# Patient Record
Sex: Female | Born: 1990 | Hispanic: Yes | Marital: Single | Smoking: Never smoker
Health system: Southern US, Community
[De-identification: ages and names within clinical notes are randomized; demographics above are authoritative.]

## PROBLEM LIST (undated history)

## (undated) DIAGNOSIS — A159 Respiratory tuberculosis unspecified: Secondary | ICD-10-CM

## (undated) HISTORY — DX: Respiratory tuberculosis unspecified: A15.9

## (undated) HISTORY — PX: NO PAST SURGERIES: SHX2092

---

## 2012-04-24 ENCOUNTER — Ambulatory Visit
Admission: RE | Admit: 2012-04-24 | Discharge: 2012-04-24 | Disposition: A | Discharge status: Home / Self Care | Payer: No Typology Code available for payment source | Source: Ambulatory Visit | Attending: Infectious Diseases | Admitting: Infectious Diseases

## 2012-04-24 ENCOUNTER — Other Ambulatory Visit: Payer: Self-pay | Admitting: Infectious Diseases

## 2012-04-24 DIAGNOSIS — R7611 Nonspecific reaction to tuberculin skin test without active tuberculosis: Secondary | ICD-10-CM

## 2013-07-31 ENCOUNTER — Inpatient Hospital Stay (HOSPITAL_COMMUNITY)
Admission: AD | Admit: 2013-07-31 | Discharge: 2013-07-31 | Disposition: A | Discharge status: Home / Self Care | Payer: No Typology Code available for payment source | Source: Ambulatory Visit | Attending: Obstetrics & Gynecology | Admitting: Obstetrics & Gynecology

## 2013-07-31 ENCOUNTER — Ambulatory Visit (INDEPENDENT_AMBULATORY_CARE_PROVIDER_SITE_OTHER): Payer: No Typology Code available for payment source | Admitting: Physician Assistant

## 2013-07-31 ENCOUNTER — Encounter (HOSPITAL_COMMUNITY): Payer: Self-pay

## 2013-07-31 VITALS — BP 100/62 | HR 85 | Temp 98.5°F | Resp 18 | Ht 62.0 in | Wt 141.0 lb

## 2013-07-31 DIAGNOSIS — N912 Amenorrhea, unspecified: Secondary | ICD-10-CM

## 2013-07-31 DIAGNOSIS — Z331 Pregnant state, incidental: Secondary | ICD-10-CM

## 2013-07-31 DIAGNOSIS — Z3490 Encounter for supervision of normal pregnancy, unspecified, unspecified trimester: Secondary | ICD-10-CM

## 2013-07-31 DIAGNOSIS — R109 Unspecified abdominal pain: Secondary | ICD-10-CM | POA: Insufficient documentation

## 2013-07-31 DIAGNOSIS — Z23 Encounter for immunization: Secondary | ICD-10-CM

## 2013-07-31 DIAGNOSIS — O99891 Other specified diseases and conditions complicating pregnancy: Secondary | ICD-10-CM | POA: Insufficient documentation

## 2013-07-31 DIAGNOSIS — Z1389 Encounter for screening for other disorder: Secondary | ICD-10-CM

## 2013-07-31 LAB — URINALYSIS, ROUTINE W REFLEX MICROSCOPIC
Bilirubin Urine: NEGATIVE
Ketones, ur: 15 mg/dL — AB
Nitrite: NEGATIVE
Protein, ur: NEGATIVE mg/dL
Specific Gravity, Urine: 1.02 (ref 1.005–1.030)
Urobilinogen, UA: 0.2 mg/dL (ref 0.0–1.0)

## 2013-07-31 LAB — URINE MICROSCOPIC-ADD ON

## 2013-07-31 LAB — POCT URINE PREGNANCY: Preg Test, Ur: POSITIVE

## 2013-07-31 MED ORDER — PRENATAL VITAMINS 0.8 MG PO TABS
1.0000 | ORAL_TABLET | Freq: Every day | ORAL | Status: DC
Start: 2013-07-31 — End: 2013-09-03

## 2013-07-31 NOTE — MAU Note (Signed)
Patient states she has had positive home pregnancy test and went to Urgent Care Today and had confirmation. States she has had cramping over the past couple of weeks she has had cramping a couple of times. Denies bleeding or vaginal discharge, no nausea or vomiting. States she was told to come to for further evaluation.

## 2013-07-31 NOTE — Progress Notes (Signed)
Patient ID: Janice Barron MRN: [id number], DOB: 11/04/1990, 22 y.o. Date of Encounter: 07/31/2013, 12:26 PM  Primary Physician: No primary provider on file.  Chief Complaint: Pregnancy test  HPI: 22 y.o. G1P0 female with history below presents for pregnancy test. LMP was around the middle half of July. She is not 100% certain of the date of this menstrual cycle. She has taken two pregnancy tests at home with the last one being just after Halloween. Both of these came back positive. She would like to confirm these today. She states that she did have a small amount of spotting around the 20th of August and thought this was her usual period. She did not think much more of this.   She did have a fleeting chest pain lasting for a few seconds in October. None since. She also complains of some abdominal pain and cramping a couple of time this past month. The most recent episode occuring on 07/30/13. Cramping is located along her lower abdomen. Lasting a few seconds and self resolving.   She has not yet established care with an OB. Not currently taking a PNV or any other medications. No tobacco use, ETOH, or illicit drugs. No influenza vaccine this year. She does not remember when her last TDaP was.     History reviewed. No pertinent past medical history.   Home Meds: Prior to Admission medications   Not on File    Allergies: No Known Allergies  History   Social History  . Marital Status: Single    Spouse Name: N/A    Number of Children: N/A  . Years of Education: N/A   Occupational History  . Not on file.   Social History Main Topics  . Smoking status: Never Smoker   . Smokeless tobacco: Not on file  . Alcohol Use: Not on file  . Drug Use: Not on file  . Sexual Activity: Not on file   Other Topics Concern  . Not on file   Social History Narrative  . No narrative on file     Review of Systems: Constitutional: negative for chills, fever, or fatigue  HEENT:  negative for vision changes, hearing loss, congestion, rhinorrhea, ST, or sinus pressure Cardiovascular: positive for chest pain. negative for palpitations Respiratory: negative for wheezing, shortness of breath, or cough Abdominal: positive for abdominal cramping and pain. negative for nausea, vomiting, diarrhea, or constipation Genitourinary: see above Dermatological: negative for rash Neurologic: negative for headache, dizziness, or syncope   Physical Exam: Blood pressure 100/62, pulse 85, temperature 98.5 F (36.9 C), temperature source Oral, resp. rate 18, height 5\' 2"  (1.575 m), weight 141 lb (63.957 kg), last menstrual period 04/21/2013, SpO2 100.00%., Body mass index is 25.78 kg/(m^2). General: Well developed, well nourished, in no acute distress. Head: Normocephalic, atraumatic, eyes without discharge, sclera non-icteric, nares are without discharge.  Neck: Supple. No thyromegaly. Full ROM. No lymphadenopathy. Lungs: Clear bilaterally to auscultation without wheezes, rales, or rhonchi. Breathing is unlabored. Heart: RRR with S1 S2. No murmurs, rubs, or gallops appreciated. Abdomen: Soft, non-tender, non-distended with normoactive bowel sounds. No hepatosplenomegaly. No rebound/guarding. Superior aspect of the uterus palpable approximately 4 cm below the umbilicus. Msk:  Strength and tone normal for age. Extremities/Skin: Warm and dry. No clubbing or cyanosis. No edema. No rashes or suspicious lesions. Neuro: Alert and oriented X 3. Moves all extremities spontaneously. Gait is normal. CNII-XII grossly in tact. Psych:  Responds to questions appropriately with a normal affect.   Doppler: Fetal  pulse documented midline  Labs: Results for orders placed in visit on 07/31/13  POCT URINE PREGNANCY      Result Value Range   Preg Test, Ur Positive     Quantitative HCG pending  ASSESSMENT AND PLAN:  22 y.o. G1P0 female with newly diagnosed pregnancy and abdominal crmaping  -Will send  to MAU for further evaluation as patient did have some abdominal pain and cramping the previous day and has yet to have an [place] -Influenza vaccine -PNV -OB referral -HCG quantitative  -Patient need TDaP at 3rd trimester  -No MMR screening to date -Unable to determine EDD secondary to we do not know LMP date  Signed, Eula Listen, PA-C [facility] [place], [place] [address] [phone] 07/31/2013 12:26 PM

## 2013-07-31 NOTE — MAU Provider Note (Signed)
Chief Complaint: No chief complaint on file.   First Provider Initiated Contact with Patient 07/31/13 1620     SUBJECTIVE HPI: Janice Barron is a 22 y.o. G1P0 at [redacted]w[redacted]d by LMP who presents to maternity admissions sent over from [facility] for unknown gestational age.  She has a referral via Urgent Care for prenatal care at either [facility] or [facility].  She reports she has regular menses and had her period in July and then some light spotting in August.  Pt reports she had some cramping earlier this week but denies pain today.  She denies LOF, vaginal bleeding, vaginal itching/burning, urinary symptoms, h/a, dizziness, n/v, or fever/chills.     Past Medical History  Diagnosis Date  . Medical history non-contributory    Past Surgical History  Procedure Laterality Date  . No past surgeries     History   Social History  . Marital Status: Single    Spouse Name: N/A    Number of Children: N/A  . Years of Education: N/A   Occupational History  . Not on file.   Social History Main Topics  . Smoking status: Never Smoker   . Smokeless tobacco: Not on file  . Alcohol Use: No  . Drug Use: No  . Sexual Activity: No   Other Topics Concern  . Not on file   Social History Narrative  . No narrative on file   No current facility-administered medications on file prior to encounter.   Current Outpatient Prescriptions on File Prior to Encounter  Medication Sig Dispense Refill  . Prenatal Multivit-Min-Fe-FA (PRENATAL VITAMINS) 0.8 MG tablet Take 1 tablet by mouth daily.  30 tablet  11   No Known Allergies  ROS: Pertinent items in HPI  OBJECTIVE Blood pressure 105/58, pulse 81, temperature 99 F (37.2 C), temperature source Oral, resp. rate 16, height 5\' 2"  (1.575 m), weight 64.683 kg (142 lb 9.6 oz), last menstrual period 04/06/2013, SpO2 100.00%. GENERAL: Well-developed, well-nourished female in no acute distress.  HEENT:  Normocephalic HEART: normal rate RESP: normal effort ABDOMEN: Soft, non-tender EXTREMITIES: Nontender, no edema NEURO: Alert and oriented SPECULUM EXAM: Deferred  Fundal height halfway between pubic symphysis and umbilicus, consistent with 16 weeks  FHT 152 by doppler  LAB RESULTS Results for orders placed during the hospital encounter of 07/31/13 (from the past 24 hour(s))  URINALYSIS, ROUTINE W REFLEX MICROSCOPIC     Status: Abnormal   Collection Time    07/31/13  3:05 PM      Result Value Range   Color, Urine YELLOW  YELLOW   APPearance CLEAR  CLEAR   Specific Gravity, Urine 1.020  1.005 - 1.030   pH 7.0  5.0 - 8.0   Glucose, UA NEGATIVE  NEGATIVE mg/dL   Hgb urine dipstick TRACE (*) NEGATIVE   Bilirubin Urine NEGATIVE  NEGATIVE   Ketones, ur 15 (*) NEGATIVE mg/dL   Protein, ur NEGATIVE  NEGATIVE mg/dL   Urobilinogen, UA 0.2  0.0 - 1.0 mg/dL   Nitrite NEGATIVE  NEGATIVE   Leukocytes, UA SMALL (*) NEGATIVE  URINE MICROSCOPIC-ADD ON     Status: Abnormal   Collection Time    07/31/13  3:05 PM      Result Value Range   Squamous Epithelial / LPF FEW (*) RARE   WBC, UA 0-2  <3 WBC/hpf   RBC / HPF 0-2  <3 RBC/hpf   Bacteria, UA FEW (*) RARE   ASSESSMENT 1. Normal  IUP (intrauterine pregnancy) on prenatal ultrasound     PLAN Discharge home Teaching done about normal second trimester of pregnancy F/U with prenatal provider as scheduled Return to MAU as needed    Medication List    ASK your doctor about these medications       Prenatal Vitamins 0.8 MG tablet  Take 1 tablet by mouth daily.         Sharen Counter Certified Nurse-Midwife 07/31/2013  4:26 PM

## 2013-08-01 LAB — HCG, QUANTITATIVE, PREGNANCY: hCG, Beta Chain, Quant, S: 16806.4 m[IU]/mL

## 2013-08-12 ENCOUNTER — Encounter: Payer: Self-pay | Admitting: Family Medicine

## 2013-08-28 ENCOUNTER — Ambulatory Visit (INDEPENDENT_AMBULATORY_CARE_PROVIDER_SITE_OTHER): Payer: No Typology Code available for payment source | Admitting: Family Medicine

## 2013-08-28 ENCOUNTER — Encounter: Payer: Self-pay | Admitting: Family Medicine

## 2013-08-28 VITALS — BP 109/63 | Temp 98.7°F | Wt 149.3 lb

## 2013-08-28 DIAGNOSIS — O0930 Supervision of pregnancy with insufficient antenatal care, unspecified trimester: Secondary | ICD-10-CM

## 2013-08-28 DIAGNOSIS — N898 Other specified noninflammatory disorders of vagina: Secondary | ICD-10-CM

## 2013-08-28 DIAGNOSIS — O0932 Supervision of pregnancy with insufficient antenatal care, second trimester: Secondary | ICD-10-CM

## 2013-08-28 LAB — POCT URINALYSIS DIP (DEVICE)
Nitrite: NEGATIVE
Protein, ur: NEGATIVE mg/dL
Specific Gravity, Urine: 1.015 (ref 1.005–1.030)
Urobilinogen, UA: 0.2 mg/dL (ref 0.0–1.0)
pH: 7 (ref 5.0–8.0)

## 2013-08-28 LAB — OB RESULTS CONSOLE GC/CHLAMYDIA
Chlamydia: NEGATIVE
Gonorrhea: NEGATIVE

## 2013-08-28 NOTE — Progress Notes (Signed)
Nutrition note: 1st visit consult Pt has gained 9.3# @ [redacted]w[redacted]d, which is wnl. Pt reports eating 4-5x/d. Pt is taking PNV. Pt reports no N/V or heartburn. NKFA. Pt received verbal & written education on general nutrition during pregnancy.   Discussed wt gain goals of 15-25# or 0.6#/wk. Pt agrees to continue taking PNV. Pt has WIC and plans to BF. F/u if referred Blondell Reveal, MS, RD, LDN, [facility]

## 2013-08-28 NOTE — Addendum Note (Signed)
Addended by: Franchot Mimes on: 08/28/2013 11:22 AM   Modules accepted: Orders

## 2013-08-28 NOTE — Addendum Note (Signed)
Addended by: Franchot Mimes on: 08/28/2013 10:32 AM   Modules accepted: Orders

## 2013-08-28 NOTE — Progress Notes (Signed)
   Subjective:    Janice Barron is a G1P0 [redacted]w[redacted]d being seen today for her first obstetrical visit.  Her obstetrical history is significant for late to care. Patient does intend to breast feed. Pregnancy history fully reviewed.  Patient reports no bleeding, no contractions, no cramping and no leaking.  Filed Vitals:   08/28/13 0937  BP: 109/63  Temp: 98.7 F (37.1 C)  Weight: 67.722 kg (149 lb 4.8 oz)    HISTORY: OB History  Gravida Para Term Preterm AB SAB TAB Ectopic Multiple Living  1             # Outcome Date GA Lbr Len/2nd Weight Sex Delivery Anes PTL Lv  1 CUR              Past Medical History  Diagnosis Date  . Medical history non-contributory   . Tuberculosis     patient had positive tb skin test earlier this year and took meds, had normal chest xray   Past Surgical History  Procedure Laterality Date  . No past surgeries     Family History  Problem Relation Age of Onset  . Cancer Maternal Grandfather   . Diabetes Mother   . Hyperlipidemia Mother      Exam    Uterus:     Pelvic Exam:    Perineum: No Hemorrhoids   Vulva: normal   Vagina:  normal mucosa   pH:     Cervix: no cervical motion tenderness and no lesions   Adnexa: not evaluated secondary to pain with speculum exam on entry   Bony Pelvis: average  System:     Skin: normal coloration and turgor, no rashes    Neurologic: oriented, normal, negative, normal mood, gait normal; reflexes normal and symmetric   Extremities: normal strength, tone, and muscle mass, no deformities   HEENT extra ocular movement intact and sclera clear, anicteric   Mouth/Teeth mucous membranes moist, pharynx normal without lesions   Neck supple and no masses   Cardiovascular: regular rate and rhythm, no murmurs or gallops   Respiratory:  appears well, vitals normal, no respiratory distress, acyanotic, normal RR, ear and throat exam is normal, neck free of mass or lymphadenopathy, chest clear, no wheezing,  crepitations, rhonchi, normal symmetric air entry   Abdomen: soft, non-tender; bowel sounds normal; no masses,  no organomegaly   Urinary: urethral meatus normal      Assessment:    Pregnancy: G1P0 There are no active problems to display for this patient.       Plan:     22 y.o. y/o G1P0 at [redacted]w[redacted]d weeks by LMP, here for New OB visit.   Discussed with Patient: - All new OB labs ordered. (cbc, abo/RH,  GC/C, RPR, Rubella, HBsAg, HIV, Urine Cx) - Physiologic changes of pregnancy/ Safe meds in pregnancy/Diet modifications, [email or web address] - Routine precautions (SAB, depression, infection s/s).  Patient provided with all pertinent phone numbers for emergencies. - Review Safety measures: seat belt and proper seatbelt application - Dating [place] ordered; Patient to schedule. -  ordered Quad screen - RTC in 4 weeks for follow up. - Reviewed appropriate weight gain To Do: 1. Anatomy scan 2. Quad screen, pap, labs  [x ] Vaccines: Flu: rec'd   Tdap:  [ ]  BCM: undecided  Edu: [x ] PTL precautions; [ ]  BF class; [ ]  childbirth class; [ ]   BF counseling     Coreen Shippee RYAN 08/28/2013

## 2013-08-28 NOTE — Patient Instructions (Signed)
Segundo trimestre del embarazo  (Second Trimester of Pregnancy) El segundo trimestre del embarazo se extiende desde la semana 13 hasta la semana 28, del 4 al 6 mes. En general, es el momento del embarazo en el que se sentir mejor. Su organismo se ha adaptado a estar embarazada y comienza a sentirse fsicamente mejor. En general las nuseas matutinas han disminuido o han desaparecido completamente. El segundo trimestre es tambin la poca en la que el feto se desarrolla rpidamente. Hacia el final del sexto mes, el beb mide aproximadamente 9 pulgadas (23 cm) y pesa alrededor de 1  libras (700 g). Es probable que sienta mover al beb (dar pataditas) entre las 18 y 20 semanas del embarazo.  CAMBIOS CORPORALES  Su organismo atravesar numerosos cambios durante el embarazo. Los cambios varan de una mujer a otra.   Seguir aumentando de peso. Notar que la parte baja del abdomen se ensancha.  Podrn aparecer las primeras estras en las caderas, abdomen y mamas.  Es posible que sienta cefaleas, que se pueden aliviar con los medicamentos que su mdico le autorice a utilizar.  Tendr necesidad de orinar con ms frecuencia porque el feto est presionando en la vejiga.  Como consecuencia del embarazo, podr sentir acidez estomacal continuamente.  Podr estar constipada ya que ciertas hormonas hacen que los msculos que empujan los desechos a travs de los intestinos trabajen ms lentamente.  Pueden aparecer hemorroides o abultarse las venas (venas varicosas).  El dolor de espalda se debe al aumento de peso y a que las hormonas del embarazo relajan las articulaciones entre los huesos de la pelvis y a la modificacin del peso y a los msculos que soportan el equilibrio.  Sus mamas seguirn desarrollndose y estarn ms sensibles.  Las encas pueden sangrar y estar sensibles al cepillado y al hilo dental.  Pueden aparecer en el rostro zonas oscuras o manchas (cloasma, mscara del embarazo). Esto  desaparece despus del nacimiento del beb.  Es posible que se formeuna lnea oscura desde el ombligo a la zona del pubis (linea nigra). Esto desaparece despus del nacimiento del beb. QU DEBE ESPERAR EN LAS CONSULTAS PRENATALES  Durante una visita prenatal de rutina:   La pesarn para verificar que usted y el feto se encuentran dentro de los lmites normales.  Le tomarn la presin arterial.  Le medirn el abdomen para verificar el desarrollo del beb.  Escucharn los latidos fetales.  Se evaluarn los resultados de los estudios realizados en visitas anteriores. El mdico le preguntar:   Cmo se siente.  Si siente los movimientos del beb.  Si tiene sntomas anormales, como prdida de lquido, sangrado, dolores de cabeza intensos o clicos abdominales.  Si tiene alguna duda. Otros estudios que podrn realizarse durante el segundo trimestre son:   Anlisis de sangre para evaluar:  Niveles bajos de hierro (anemia).  Diabetes gestacional (entre las 24 y las 28 semanas).  Anticuerpos Rh.  Anlisis de orina para detectar infecciones, diabetes o protenas en la orina.  Una ecografa para confirmar si el crecimiento y el desarrollo del beb son los adecuados.  Una amniocentesis para diagnosticar posibles problemas genticos.  Estudios del feto para descartar espina bfida y sndrome de [place]. INSTRUCCIONES PARA EL CUIDADO EN EL HOGAR   Evite fumar, consumir hierbas, beber alcohol y utilizar frmacos que no ne hayan recetado. Estas sustancias qumicas afectan la formacin y el desarrollo del beb.  Siga las indicaciones del profesional con respecto a como tomar los medicamentos. Durante el embarazo, hay   medicamentos que son seguros y otros no lo son.  Realice actividad fsica slo segn las indicaciones del mdico. Sentir clicos uterinos es el mejor signo para detener la actividad fsica.  Contine haciendo comidas regulares y sanas.  Use un sostn que le brinde buen  soporte si sus mamas estn sensibles.  No utilice la baera con agua caliente, baos turcos y saunas.  Colquese el cinturn de seguridad cuando conduzca.  Evite comer carne cruda y el contacto con los utensilios y desperdicios de los gatos. Estos elementos contienen grmenes que pueden causar defectos de nacimiento en el beb.  Tome las vitaminas indicadas para la etapa prenatal.  Pruebe un laxante (si el mdico la autoriza) si tiene constipacin. Consuma ms alimentos ricos en fibra, como vegetales y frutas frescos y cereales enteros. Beba gran cantidad de lquido para mantener la orina de tono claro o amarillo plido.  Tome baos de agua tibia para calmar el dolor o las molestias causadas por las hemorroides. Use una crema para las hemorroides si el mdico la autoriza.  Si tiene venas varicosas, use medias de soporte. Eleve los pies durante 15 minutos, 3 o 4 veces por da. Limite el consumo de sal en su dieta.  Evite levantar objetos pesados, use zapatos de tacones bajos y mantenga una buena postura.  Descanse con las piernas elevadas si tiene calambres o dolor de cintura.  Visite a su dentista si no lo ha hecho durante el embarazo. Use un cepillo de dientes blando para higienizarse los dientes y use suavemente el hilo dental.  Puede continuar su vida sexual siempre que el mdico la autorice.  Concurra a todas las visitas prenatales segn las indicaciones de su mdico. SOLICITE ATENCIN MDICA SI:   Tiene mareos.  Siente clicos leves, presin en la pelvis o dolor persistente en el abdomen.  Tiene nuseas o vmitos o diarrea persistentes.  Observa una secrecin vaginal con mal olor.  Siente dolor al orinar. SOLICITE ATENCIN MDICA DE INMEDIATO SI:   Tiene fiebre.  Pierde lquido por la vagina.  Tiene sangrando o pequeas prdidas vaginales.  Siente dolor intenso o clicos en el abdomen.  Sube o baja de peso rpidamente.  Tiene dificultad para respirar y le duele  pecho.  Sbitamente se le hincha el rostro, las manos, los tobillos, los pies o las piernas de manera extrema.  No ha sentido los movimientos del beb durante una hora.  Siente un dolor de cabeza intenso que no se alivia con medicamentos.  Su visin se modifica. Document Released: 06/14/2005 Document Revised: 05/07/2013 [employer] Patient Information 2014 [employer].  

## 2013-08-28 NOTE — Progress Notes (Signed)
U/S scheduled 09/03/13 at 845 am.

## 2013-08-28 NOTE — Progress Notes (Signed)
Pulse: Patient reports that she has not had an ultrasound yet. The LMP that her dating is by was a guess she told the nurse in [facility]U.  Pt will do early glucola because her mother has type 2 diabetes.

## 2013-08-29 LAB — OBSTETRIC PANEL
Antibody Screen: NEGATIVE
Basophils Relative: 0 % (ref 0–1)
Eosinophils Absolute: 0 10*3/uL (ref 0.0–0.7)
Eosinophils Relative: 0 % (ref 0–5)
Lymphocytes Relative: 12 % (ref 12–46)
Lymphs Abs: 1.1 10*3/uL (ref 0.7–4.0)
MCH: 31.9 pg (ref 26.0–34.0)
MCHC: 34 g/dL (ref 30.0–36.0)
MCV: 93.9 fL (ref 78.0–100.0)
Neutrophils Relative %: 82 % — ABNORMAL HIGH (ref 43–77)
Platelets: 226 10*3/uL (ref 150–400)
RBC: 3.45 MIL/uL — ABNORMAL LOW (ref 3.87–5.11)
RDW: 13.4 % (ref 11.5–15.5)
Rubella: 2.1 Index — ABNORMAL HIGH (ref <0.90)

## 2013-08-29 LAB — HIV ANTIBODY (ROUTINE TESTING W REFLEX): HIV: NONREACTIVE

## 2013-08-29 LAB — PRESCRIPTION MONITORING PROFILE (19 PANEL)
Amphetamine/Meth: NEGATIVE ng/mL
Buprenorphine, Urine: NEGATIVE ng/mL
Cannabinoid Scrn, Ur: NEGATIVE ng/mL
Carisoprodol, Urine: NEGATIVE ng/mL
Creatinine, Urine: 45.4 mg/dL (ref >20.0)
Fentanyl, Ur: NEGATIVE ng/mL
MDMA URINE: NEGATIVE ng/mL
Meperidine, Ur: NEGATIVE ng/mL
Methadone Screen, Urine: NEGATIVE ng/mL
Methaqualone: NEGATIVE ng/mL
Oxycodone Screen, Ur: NEGATIVE ng/mL
Phencyclidine, Ur: NEGATIVE ng/mL
pH, Initial: 7.5 pH (ref 4.5–8.9)

## 2013-08-29 LAB — WET PREP, GENITAL
Clue Cells Wet Prep HPF POC: NONE SEEN
Yeast Wet Prep HPF POC: NONE SEEN

## 2013-09-01 LAB — HEMOGLOBINOPATHY EVALUATION
Hgb A: 97.2 % (ref 96.8–97.8)
Hgb S Quant: 0 %

## 2013-09-03 ENCOUNTER — Ambulatory Visit (HOSPITAL_COMMUNITY)
Admission: RE | Admit: 2013-09-03 | Discharge: 2013-09-03 | Disposition: A | Discharge status: Home / Self Care | Payer: No Typology Code available for payment source | Source: Ambulatory Visit | Attending: Family Medicine | Admitting: Family Medicine

## 2013-09-03 ENCOUNTER — Telehealth: Payer: Self-pay | Admitting: *Deleted

## 2013-09-03 DIAGNOSIS — O0932 Supervision of pregnancy with insufficient antenatal care, second trimester: Secondary | ICD-10-CM

## 2013-09-03 DIAGNOSIS — Z3689 Encounter for other specified antenatal screening: Secondary | ICD-10-CM | POA: Insufficient documentation

## 2013-09-03 DIAGNOSIS — Z3490 Encounter for supervision of normal pregnancy, unspecified, unspecified trimester: Secondary | ICD-10-CM

## 2013-09-03 MED ORDER — PRENATAL VITAMINS 0.8 MG PO TABS: 1.0000 | ORAL_TABLET | Freq: Every day | ORAL | Status: DC

## 2013-09-03 NOTE — Telephone Encounter (Signed)
Patient is requesting prenatal vitamins. Rx sent to her pharmacy.

## 2013-09-04 ENCOUNTER — Encounter: Payer: Self-pay | Admitting: Family Medicine

## 2013-09-18 NOTE — L&D Delivery Note (Signed)
Delivery Note At 8:28 AM a viable and healthy female was delivered via Vaginal, Spontaneous Delivery (Presentation: Right Occiput Anterior).  APGAR: 9, 9; weight pending .   Placenta status: Intact, Spontaneous.  Cord: 3 vessels Complications: None.    Anesthesia: Local  Episiotomy: None Lacerations: 3rd degree Suture Repair: 3.0 vicryl Est. Blood Loss (mL): 300ml  23 year old G1P0 @ 1464w4d induced delivered a viable female infant over an intact perineum. Active management of third stage of labor. Pitocin given. Placenta delivered spontaneously. 3 vessel cord, placenta complete. Partial 3rd degree laceration to the capsule muscles intact.  Repaired with figure of 3-0 Vicryl.   Mom to postpartum.  Baby to Couplet care / Skin to Skin.  Myriam JacobsonRobyn H Alvine Mostafa 01/08/2014, 9:32 AM

## 2013-09-23 ENCOUNTER — Ambulatory Visit (INDEPENDENT_AMBULATORY_CARE_PROVIDER_SITE_OTHER): Payer: No Typology Code available for payment source | Admitting: Obstetrics & Gynecology

## 2013-09-23 VITALS — BP 104/61 | Temp 98.8°F | Wt 154.7 lb

## 2013-09-23 DIAGNOSIS — Z3482 Encounter for supervision of other normal pregnancy, second trimester: Secondary | ICD-10-CM

## 2013-09-23 DIAGNOSIS — Z3480 Encounter for supervision of other normal pregnancy, unspecified trimester: Secondary | ICD-10-CM

## 2013-09-23 LAB — POCT URINALYSIS DIP (DEVICE)
Bilirubin Urine: NEGATIVE
Glucose, UA: NEGATIVE mg/dL
HGB URINE DIPSTICK: NEGATIVE
Ketones, ur: NEGATIVE mg/dL
Nitrite: NEGATIVE
PROTEIN: NEGATIVE mg/dL
SPECIFIC GRAVITY, URINE: 1.015 (ref 1.005–1.030)
Urobilinogen, UA: 0.2 mg/dL (ref 0.0–1.0)
pH: 7.5 (ref 5.0–8.0)

## 2013-09-23 NOTE — Progress Notes (Signed)
P=89 . C/o pain RLQ yesterday when walking, sometimes feels pain in hip when walking.

## 2013-09-23 NOTE — Patient Instructions (Signed)
Segundo trimestre del embarazo  (Second Trimester of Pregnancy) El segundo trimestre del embarazo se extiende desde la semana 13 hasta la semana 28, del 4 al 6 mes. En general, es el momento del embarazo en el que se sentir mejor. Su organismo se ha adaptado a estar embarazada y comienza a sentirse fsicamente mejor. En general las nuseas matutinas han disminuido o han desaparecido completamente. El segundo trimestre es tambin la poca en la que el feto se desarrolla rpidamente. Hacia el final del sexto mes, el beb mide aproximadamente 9 pulgadas (23 cm) y pesa alrededor de 1  libras (700 g). Es probable que sienta mover al beb (dar pataditas) entre las 18 y 20 semanas del embarazo.  CAMBIOS CORPORALES  Su organismo atravesar numerosos cambios durante el embarazo. Los cambios varan de una mujer a otra.   Seguir aumentando de peso. Notar que la parte baja del abdomen se ensancha.  Podrn aparecer las primeras estras en las caderas, abdomen y mamas.  Es posible que sienta cefaleas, que se pueden aliviar con los medicamentos que su mdico le autorice a utilizar.  Tendr necesidad de orinar con ms frecuencia porque el feto est presionando en la vejiga.  Como consecuencia del embarazo, podr sentir acidez estomacal continuamente.  Podr estar constipada ya que ciertas hormonas hacen que los msculos que empujan los desechos a travs de los intestinos trabajen ms lentamente.  Pueden aparecer hemorroides o abultarse las venas (venas varicosas).  El dolor de espalda se debe al aumento de peso y a que las hormonas del embarazo relajan las articulaciones entre los huesos de la pelvis y a la modificacin del peso y a los msculos que soportan el equilibrio.  Sus mamas seguirn desarrollndose y estarn ms sensibles.  Las encas pueden sangrar y estar sensibles al cepillado y al hilo dental.  Pueden aparecer en el rostro zonas oscuras o manchas (cloasma, mscara del embarazo). Esto  desaparece despus del nacimiento del beb.  Es posible que se formeuna lnea oscura desde el ombligo a la zona del pubis (linea nigra). Esto desaparece despus del nacimiento del beb. QU DEBE ESPERAR EN LAS CONSULTAS PRENATALES  Durante una visita prenatal de rutina:   La pesarn para verificar que usted y el feto se encuentran dentro de los lmites normales.  Le tomarn la presin arterial.  Le medirn el abdomen para verificar el desarrollo del beb.  Escucharn los latidos fetales.  Se evaluarn los resultados de los estudios realizados en visitas anteriores. El mdico le preguntar:   Cmo se siente.  Si siente los movimientos del beb.  Si tiene sntomas anormales, como prdida de lquido, sangrado, dolores de cabeza intensos o clicos abdominales.  Si tiene alguna duda. Otros estudios que podrn realizarse durante el segundo trimestre son:   Anlisis de sangre para evaluar:  Niveles bajos de hierro (anemia).  Diabetes gestacional (entre las 24 y las 28 semanas).  Anticuerpos Rh.  Anlisis de orina para detectar infecciones, diabetes o protenas en la orina.  Una ecografa para confirmar si el crecimiento y el desarrollo del beb son los adecuados.  Una amniocentesis para diagnosticar posibles problemas genticos.  Estudios del feto para descartar espina bfida y sndrome de [place]. INSTRUCCIONES PARA EL CUIDADO EN EL HOGAR   Evite fumar, consumir hierbas, beber alcohol y utilizar frmacos que no ne hayan recetado. Estas sustancias qumicas afectan la formacin y el desarrollo del beb.  Siga las indicaciones del profesional con respecto a como tomar los medicamentos. Durante el embarazo, hay   medicamentos que son seguros y otros no lo son.  Realice actividad fsica slo segn las indicaciones del mdico. Sentir clicos uterinos es el mejor signo para detener la actividad fsica.  Contine haciendo comidas regulares y sanas.  Use un sostn que le brinde buen  soporte si sus mamas estn sensibles.  No utilice la baera con agua caliente, baos turcos y saunas.  Colquese el cinturn de seguridad cuando conduzca.  Evite comer carne cruda y el contacto con los utensilios y desperdicios de los gatos. Estos elementos contienen grmenes que pueden causar defectos de nacimiento en el beb.  Tome las vitaminas indicadas para la etapa prenatal.  Pruebe un laxante (si el mdico la autoriza) si tiene constipacin. Consuma ms alimentos ricos en fibra, como vegetales y frutas frescos y cereales enteros. Beba gran cantidad de lquido para mantener la orina de tono claro o amarillo plido.  Tome baos de agua tibia para calmar el dolor o las molestias causadas por las hemorroides. Use una crema para las hemorroides si el mdico la autoriza.  Si tiene venas varicosas, use medias de soporte. Eleve los pies durante 15 minutos, 3 o 4 veces por da. Limite el consumo de sal en su dieta.  Evite levantar objetos pesados, use zapatos de tacones bajos y mantenga una buena postura.  Descanse con las piernas elevadas si tiene calambres o dolor de cintura.  Visite a su dentista si no lo ha hecho durante el embarazo. Use un cepillo de dientes blando para higienizarse los dientes y use suavemente el hilo dental.  Puede continuar su vida sexual siempre que el mdico la autorice.  Concurra a todas las visitas prenatales segn las indicaciones de su mdico. SOLICITE ATENCIN MDICA SI:   Tiene mareos.  Siente clicos leves, presin en la pelvis o dolor persistente en el abdomen.  Tiene nuseas o vmitos o diarrea persistentes.  Observa una secrecin vaginal con mal olor.  Siente dolor al orinar. SOLICITE ATENCIN MDICA DE INMEDIATO SI:   Tiene fiebre.  Pierde lquido por la vagina.  Tiene sangrando o pequeas prdidas vaginales.  Siente dolor intenso o clicos en el abdomen.  Sube o baja de peso rpidamente.  Tiene dificultad para respirar y le duele  pecho.  Sbitamente se le hincha el rostro, las manos, los tobillos, los pies o las piernas de manera extrema.  No ha sentido los movimientos del beb durante una hora.  Siente un dolor de cabeza intenso que no se alivia con medicamentos.  Su visin se modifica. Document Released: 06/14/2005 Document Revised: 05/07/2013 [employer] Patient Information 2014 [employer].  

## 2013-09-24 NOTE — Progress Notes (Signed)
Pt with no complaints. Needs 28 week labs next visit

## 2013-10-01 ENCOUNTER — Ambulatory Visit (HOSPITAL_COMMUNITY): Payer: No Typology Code available for payment source

## 2013-10-01 ENCOUNTER — Ambulatory Visit (HOSPITAL_COMMUNITY): Admission: RE | Admit: 2013-10-01 | Payer: No Typology Code available for payment source | Source: Ambulatory Visit

## 2013-10-07 ENCOUNTER — Other Ambulatory Visit: Payer: Self-pay | Admitting: Obstetrics & Gynecology

## 2013-10-07 ENCOUNTER — Ambulatory Visit (HOSPITAL_COMMUNITY)
Admission: RE | Admit: 2013-10-07 | Discharge: 2013-10-07 | Disposition: A | Discharge status: Home / Self Care | Payer: Medicaid Other | Source: Ambulatory Visit | Attending: Obstetrics & Gynecology | Admitting: Obstetrics & Gynecology

## 2013-10-07 DIAGNOSIS — Z09 Encounter for follow-up examination after completed treatment for conditions other than malignant neoplasm: Secondary | ICD-10-CM

## 2013-10-07 DIAGNOSIS — Z3689 Encounter for other specified antenatal screening: Secondary | ICD-10-CM | POA: Insufficient documentation

## 2013-10-15 ENCOUNTER — Encounter: Payer: Self-pay | Admitting: Advanced Practice Midwife

## 2013-10-15 ENCOUNTER — Ambulatory Visit (INDEPENDENT_AMBULATORY_CARE_PROVIDER_SITE_OTHER): Payer: Medicaid Other | Admitting: Advanced Practice Midwife

## 2013-10-15 ENCOUNTER — Encounter: Payer: Self-pay | Admitting: Obstetrics and Gynecology

## 2013-10-15 VITALS — BP 113/66 | Temp 97.7°F | Wt 159.2 lb

## 2013-10-15 DIAGNOSIS — Z23 Encounter for immunization: Secondary | ICD-10-CM

## 2013-10-15 DIAGNOSIS — Z3400 Encounter for supervision of normal first pregnancy, unspecified trimester: Secondary | ICD-10-CM

## 2013-10-15 DIAGNOSIS — Z3490 Encounter for supervision of normal pregnancy, unspecified, unspecified trimester: Secondary | ICD-10-CM

## 2013-10-15 LAB — CBC
HEMATOCRIT: 34.5 % — AB (ref 36.0–46.0)
Hemoglobin: 11.7 g/dL — ABNORMAL LOW (ref 12.0–15.0)
MCH: 31.8 pg (ref 26.0–34.0)
MCHC: 33.9 g/dL (ref 30.0–36.0)
MCV: 93.8 fL (ref 78.0–100.0)
PLATELETS: 225 10*3/uL (ref 150–400)
RBC: 3.68 MIL/uL — AB (ref 3.87–5.11)
RDW: 13.2 % (ref 11.5–15.5)
WBC: 9.5 10*3/uL (ref 4.0–10.5)

## 2013-10-15 LAB — POCT URINALYSIS DIP (DEVICE)
Bilirubin Urine: NEGATIVE
GLUCOSE, UA: NEGATIVE mg/dL
KETONES UR: NEGATIVE mg/dL
Nitrite: NEGATIVE
Protein, ur: NEGATIVE mg/dL
SPECIFIC GRAVITY, URINE: 1.02 (ref 1.005–1.030)
UROBILINOGEN UA: 0.2 mg/dL (ref 0.0–1.0)
pH: 7 (ref 5.0–8.0)

## 2013-10-15 MED ORDER — TETANUS-DIPHTH-ACELL PERTUSSIS 5-2.5-18.5 LF-MCG/0.5 IM SUSP: 0.5000 mL | Freq: Once | INTRAMUSCULAR | Status: DC

## 2013-10-15 MED ORDER — TETANUS-DIPHTH-ACELL PERTUSSIS 5-2.5-18.5 LF-MCG/0.5 IM SUSP
0.5000 mL | Freq: Once | INTRAMUSCULAR | Status: DC
Start: 2013-10-15 — End: 2013-10-29

## 2013-10-15 NOTE — Progress Notes (Signed)
Pulse- 95 Patient reports pelvic pain and states that her job is becoming more difficult because she has to lift heavy boxes often

## 2013-10-15 NOTE — Patient Instructions (Signed)
Third Trimester of Pregnancy  The third trimester is from week 29 through week 42, months 7 through 9. The third trimester is a time when the fetus is growing rapidly. At the end of the ninth month, the fetus is about 20 inches in length and weighs 6 10 pounds.   BODY CHANGES  Your body goes through many changes during pregnancy. The changes vary from woman to woman.    Your weight will continue to increase. You can expect to gain 25 35 pounds (11 16 kg) by the end of the pregnancy.   You may begin to get stretch marks on your hips, abdomen, and breasts.   You may urinate more often because the fetus is moving lower into your pelvis and pressing on your bladder.   You may develop or continue to have heartburn as a result of your pregnancy.   You may develop constipation because certain hormones are causing the muscles that push waste through your intestines to slow down.   You may develop hemorrhoids or swollen, bulging veins (varicose veins).   You may have pelvic pain because of the weight gain and pregnancy hormones relaxing your joints between the bones in your pelvis. Back aches may result from over exertion of the muscles supporting your posture.   Your breasts will continue to grow and be tender. A yellow discharge may leak from your breasts called colostrum.   Your belly button may stick out.   You may feel short of breath because of your expanding uterus.   You may notice the fetus "dropping," or moving lower in your abdomen.   You may have a bloody mucus discharge. This usually occurs a few days to a week before labor begins.   Your cervix becomes thin and soft (effaced) near your due date.  WHAT TO EXPECT AT YOUR PRENATAL EXAMS   You will have prenatal exams every 2 weeks until week 36. Then, you will have weekly prenatal exams. During a routine prenatal visit:   You will be weighed to make sure you and the fetus are growing normally.   Your blood pressure is taken.   Your abdomen will be  measured to track your baby's growth.   The fetal heartbeat will be listened to.   Any test results from the previous visit will be discussed.   You may have a cervical check near your due date to see if you have effaced.  At around 36 weeks, your caregiver will check your cervix. At the same time, your caregiver will also perform a test on the secretions of the vaginal tissue. This test is to determine if a type of bacteria, Group B streptococcus, is present. Your caregiver will explain this further.  Your caregiver may ask you:   What your birth plan is.   How you are feeling.   If you are feeling the baby move.   If you have had any abnormal symptoms, such as leaking fluid, bleeding, severe headaches, or abdominal cramping.   If you have any questions.  Other tests or screenings that may be performed during your third trimester include:   Blood tests that check for low iron levels (anemia).   Fetal testing to check the health, activity level, and growth of the fetus. Testing is done if you have certain medical conditions or if there are problems during the pregnancy.  FALSE LABOR  You may feel small, irregular contractions that eventually go away. These are called Braxton Hicks contractions, or   false labor. Contractions may last for hours, days, or even weeks before true labor sets in. If contractions come at regular intervals, intensify, or become painful, it is best to be seen by your caregiver.   SIGNS OF LABOR    Menstrual-like cramps.   Contractions that are 5 minutes apart or less.   Contractions that start on the top of the uterus and spread down to the lower abdomen and back.   A sense of increased pelvic pressure or back pain.   A watery or bloody mucus discharge that comes from the vagina.  If you have any of these signs before the 37th week of pregnancy, call your caregiver right away. You need to go to the hospital to get checked immediately.  HOME CARE INSTRUCTIONS    Avoid all  smoking, herbs, alcohol, and unprescribed drugs. These chemicals affect the formation and growth of the baby.   Follow your caregiver's instructions regarding medicine use. There are medicines that are either safe or unsafe to take during pregnancy.   Exercise only as directed by your caregiver. Experiencing uterine cramps is a good sign to stop exercising.   Continue to eat regular, healthy meals.   Wear a good support bra for breast tenderness.   Do not use hot tubs, steam rooms, or saunas.   Wear your seat belt at all times when driving.   Avoid raw meat, uncooked cheese, cat litter boxes, and soil used by cats. These carry germs that can cause birth defects in the baby.   Take your prenatal vitamins.   Try taking a stool softener (if your caregiver approves) if you develop constipation. Eat more high-fiber foods, such as fresh vegetables or fruit and whole grains. Drink plenty of fluids to keep your urine clear or pale yellow.   Take warm sitz baths to soothe any pain or discomfort caused by hemorrhoids. Use hemorrhoid cream if your caregiver approves.   If you develop varicose veins, wear support hose. Elevate your feet for 15 minutes, 3 4 times a day. Limit salt in your diet.   Avoid heavy lifting, wear low heal shoes, and practice good posture.   Rest a lot with your legs elevated if you have leg cramps or low back pain.   Visit your dentist if you have not gone during your pregnancy. Use a soft toothbrush to brush your teeth and be gentle when you floss.   A sexual relationship may be continued unless your caregiver directs you otherwise.   Do not travel far distances unless it is absolutely necessary and only with the approval of your caregiver.   Take prenatal classes to understand, practice, and ask questions about the labor and delivery.   Make a trial run to the hospital.   Pack your hospital bag.   Prepare the baby's nursery.   Continue to go to all your prenatal visits as directed  by your caregiver.  SEEK MEDICAL CARE IF:   You are unsure if you are in labor or if your water has broken.   You have dizziness.   You have mild pelvic cramps, pelvic pressure, or nagging pain in your abdominal area.   You have persistent nausea, vomiting, or diarrhea.   You have a bad smelling vaginal discharge.   You have pain with urination.  SEEK IMMEDIATE MEDICAL CARE IF:    You have a fever.   You are leaking fluid from your vagina.   You have spotting or bleeding from your vagina.     You have severe abdominal cramping or pain.   You have rapid weight loss or gain.   You have shortness of breath with chest pain.   You notice sudden or extreme swelling of your face, hands, ankles, feet, or legs.   You have not felt your baby move in over an hour.   You have severe headaches that do not go away with medicine.   You have vision changes.  Document Released: 08/29/2001 Document Revised: 05/07/2013 Document Reviewed: 11/05/2012  ExitCare Patient Information 2014 [employer].

## 2013-10-15 NOTE — Progress Notes (Signed)
Discussed lifting restrictions, will give letter. Works at [employer]. Glucola and labs today

## 2013-10-15 NOTE — Addendum Note (Signed)
Addended by: Louanna RawAMPBELL, Leahmarie Gasiorowski M on: 10/15/2013 11:07 AM   Modules accepted: Orders

## 2013-10-16 LAB — GLUCOSE TOLERANCE, 1 HOUR (50G) W/O FASTING: Glucose, 1 Hour GTT: 88 mg/dL (ref 70–140)

## 2013-10-16 LAB — RPR

## 2013-10-16 LAB — HIV ANTIBODY (ROUTINE TESTING W REFLEX): HIV: NONREACTIVE

## 2013-10-20 ENCOUNTER — Encounter: Payer: Self-pay | Admitting: Advanced Practice Midwife

## 2013-10-21 ENCOUNTER — Encounter: Payer: No Typology Code available for payment source | Admitting: Obstetrics and Gynecology

## 2013-10-29 ENCOUNTER — Encounter: Payer: Self-pay | Admitting: Obstetrics and Gynecology

## 2013-10-29 ENCOUNTER — Ambulatory Visit (INDEPENDENT_AMBULATORY_CARE_PROVIDER_SITE_OTHER): Payer: Medicaid Other | Admitting: Obstetrics and Gynecology

## 2013-10-29 VITALS — BP 107/65 | Temp 97.7°F | Wt 162.8 lb

## 2013-10-29 DIAGNOSIS — O0932 Supervision of pregnancy with insufficient antenatal care, second trimester: Secondary | ICD-10-CM

## 2013-10-29 DIAGNOSIS — O0930 Supervision of pregnancy with insufficient antenatal care, unspecified trimester: Secondary | ICD-10-CM

## 2013-10-29 LAB — POCT URINALYSIS DIP (DEVICE)
Bilirubin Urine: NEGATIVE
Glucose, UA: NEGATIVE mg/dL
Hgb urine dipstick: NEGATIVE
Ketones, ur: NEGATIVE mg/dL
NITRITE: NEGATIVE
PH: 7.5 (ref 5.0–8.0)
Protein, ur: NEGATIVE mg/dL
Specific Gravity, Urine: 1.02 (ref 1.005–1.030)
Urobilinogen, UA: 0.2 mg/dL (ref 0.0–1.0)

## 2013-10-29 NOTE — Progress Notes (Signed)
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 RLP discussed. Labs reviewed> normal.

## 2013-10-29 NOTE — Patient Instructions (Signed)
Contraception Choices Contraception (birth control) is the use of any methods or devices to prevent pregnancy. Below are some methods to help avoid pregnancy. HORMONAL METHODS   Contraceptive implant This is a thin, plastic tube containing progesterone hormone. It does not contain estrogen hormone. Your health care provider inserts the tube in the inner part of the upper arm. The tube can remain in place for up to 3 years. After 3 years, the implant must be removed. The implant prevents the ovaries from releasing an egg (ovulation), thickens the cervical mucus to prevent sperm from entering the uterus, and thins the lining of the inside of the uterus.  Progesterone-only injections These injections are given every 3 months by your health care provider to prevent pregnancy. This synthetic progesterone hormone stops the ovaries from releasing eggs. It also thickens cervical mucus and changes the uterine lining. This makes it harder for sperm to survive in the uterus.  Birth control pills These pills contain estrogen and progesterone hormone. They work by preventing the ovaries from releasing eggs (ovulation). They also cause the cervical mucus to thicken, preventing the sperm from entering the uterus. Birth control pills are prescribed by a health care provider.Birth control pills can also be used to treat heavy periods.  Minipill This type of birth control pill contains only the progesterone hormone. They are taken every day of each month and must be prescribed by your health care provider.  Birth control patch The patch contains hormones similar to those in birth control pills. It must be changed once a week and is prescribed by a health care provider.  Vaginal ring The ring contains hormones similar to those in birth control pills. It is left in the vagina for 3 weeks, removed for 1 week, and then a new one is put back in place. The patient must be comfortable inserting and removing the ring from the  vagina.A health care provider's prescription is necessary.  Emergency contraception Emergency contraceptives prevent pregnancy after unprotected sexual intercourse. This pill can be taken right after sex or up to 5 days after unprotected sex. It is most effective the sooner you take the pills after having sexual intercourse. Most emergency contraceptive pills are available without a prescription. Check with your pharmacist. Do not use emergency contraception as your only form of birth control. BARRIER METHODS   Female condom This is a thin sheath (latex or rubber) that is worn over the penis during sexual intercourse. It can be used with spermicide to increase effectiveness.  Female condom. This is a soft, loose-fitting sheath that is put into the vagina before sexual intercourse.  Diaphragm This is a soft, latex, dome-shaped barrier that must be fitted by a health care provider. It is inserted into the vagina, along with a spermicidal jelly. It is inserted before intercourse. The diaphragm should be left in the vagina for 6 to 8 hours after intercourse.  Cervical cap This is a round, soft, latex or plastic cup that fits over the cervix and must be fitted by a health care provider. The cap can be left in place for up to 48 hours after intercourse.  Sponge This is a soft, circular piece of polyurethane foam. The sponge has spermicide in it. It is inserted into the vagina after wetting it and before sexual intercourse.  Spermicides These are chemicals that kill or block sperm from entering the cervix and uterus. They come in the form of creams, jellies, suppositories, foam, or tablets. They do not require a   prescription. They are inserted into the vagina with an applicator before having sexual intercourse. The process must be repeated every time you have sexual intercourse. INTRAUTERINE CONTRACEPTION  Intrauterine device (IUD) This is a T-shaped device that is put in a woman's uterus during a  menstrual period to prevent pregnancy. There are 2 types:  Copper IUD This type of IUD is wrapped in copper wire and is placed inside the uterus. Copper makes the uterus and fallopian tubes produce a fluid that kills sperm. It can stay in place for 10 years.  Hormone IUD This type of IUD contains the hormone progestin (synthetic progesterone). The hormone thickens the cervical mucus and prevents sperm from entering the uterus, and it also thins the uterine lining to prevent implantation of a fertilized egg. The hormone can weaken or kill the sperm that get into the uterus. It can stay in place for 3 5 years, depending on which type of IUD is used. PERMANENT METHODS OF CONTRACEPTION  Female tubal ligation This is when the woman's fallopian tubes are surgically sealed, tied, or blocked to prevent the egg from traveling to the uterus.  Hysteroscopic sterilization This involves placing a small coil or insert into each fallopian tube. Your doctor uses a technique called hysteroscopy to do the procedure. The device causes scar tissue to form. This results in permanent blockage of the fallopian tubes, so the sperm cannot fertilize the egg. It takes about 3 months after the procedure for the tubes to become blocked. You must use another form of birth control for these 3 months.  Female sterilization This is when the female has the tubes that carry sperm tied off (vasectomy).This blocks sperm from entering the vagina during sexual intercourse. After the procedure, the man can still ejaculate fluid (semen). NATURAL PLANNING METHODS  Natural family planning This is not having sexual intercourse or using a barrier method (condom, diaphragm, cervical cap) on days the woman could become pregnant.  Calendar method This is keeping track of the length of each menstrual cycle and identifying when you are fertile.  Ovulation method This is avoiding sexual intercourse during ovulation.  Symptothermal method This is  avoiding sexual intercourse during ovulation, using a thermometer and ovulation symptoms.  Post ovulation method This is timing sexual intercourse after you have ovulated. Regardless of which type or method of contraception you choose, it is important that you use condoms to protect against the transmission of sexually transmitted infections (STIs). Talk with your health care provider about which form of contraception is most appropriate for you. Document Released: 09/04/2005 Document Revised: 05/07/2013 Document Reviewed: 02/27/2013 ExitCare Patient Information 2014 [employer].  

## 2013-10-29 NOTE — Progress Notes (Signed)
Pulse- 97 Patient reports pain in buttocks

## 2013-10-30 ENCOUNTER — Encounter: Payer: Self-pay | Admitting: *Deleted

## 2013-10-31 ENCOUNTER — Telehealth: Payer: Self-pay | Admitting: *Deleted

## 2013-10-31 NOTE — Telephone Encounter (Signed)
Per chart review patient is not out of work on leave- may work with weight restrictions. Called Kim at [employer] and discussed this.  She will follow up with patient.

## 2013-10-31 NOTE — Telephone Encounter (Signed)
Kim from [employer] called and left message stating she needs information to confirm date of disability, why patient is out of work, treatment plan and next office visit date. Call Kim at [phone] ext [phone]

## 2013-11-12 ENCOUNTER — Ambulatory Visit (INDEPENDENT_AMBULATORY_CARE_PROVIDER_SITE_OTHER): Payer: Medicaid Other | Admitting: Obstetrics and Gynecology

## 2013-11-12 ENCOUNTER — Encounter: Payer: Self-pay | Admitting: Obstetrics and Gynecology

## 2013-11-12 VITALS — BP 104/60 | Temp 97.5°F | Wt 164.3 lb

## 2013-11-12 DIAGNOSIS — O0930 Supervision of pregnancy with insufficient antenatal care, unspecified trimester: Secondary | ICD-10-CM

## 2013-11-12 DIAGNOSIS — O0932 Supervision of pregnancy with insufficient antenatal care, second trimester: Secondary | ICD-10-CM

## 2013-11-12 LAB — POCT URINALYSIS DIP (DEVICE)
Bilirubin Urine: NEGATIVE
Glucose, UA: NEGATIVE mg/dL
Ketones, ur: NEGATIVE mg/dL
Nitrite: NEGATIVE
PROTEIN: 30 mg/dL — AB
SPECIFIC GRAVITY, URINE: 1.02 (ref 1.005–1.030)
Urobilinogen, UA: 0.2 mg/dL (ref 0.0–1.0)
pH: 7 (ref 5.0–8.0)

## 2013-11-12 NOTE — Progress Notes (Signed)
P = 92 

## 2013-11-12 NOTE — Progress Notes (Signed)
Patient had no complaints. No nausea, vomiting and no discharge. She is still working.   PE:  See clinical sheet   Plan: Routine care.

## 2013-11-12 NOTE — Progress Notes (Signed)
Evaluation and management procedures were performed by Resident physician under my supervision/collaboration. Chart reviewed, patient examined by me and I agree with management and plan. [name], CNM 11/12/2013 3:02 PM

## 2013-11-12 NOTE — Patient Instructions (Signed)
Tercer trimestre del embarazo  (Third Trimester of Pregnancy) El tercer trimestre del embarazo abarca desde la semana 29 hasta la semana 42, desde el 7 mes hasta el 9. En este trimestre el feto se desarrolla muy rpidamente. Hacia el final del noveno mes, el beb que an no ha nacido mide alrededor de 20 pulgadas (45 cm) de largo y pesa entre 6 y 10 libras (2,700 y 4,500 kg).  CAMBIOS CORPORALES  Su organismo atravesar numerosos cambios durante el embarazo. Los cambios varan de una mujer a otra.   Seguir aumentando de peso. Es esperable que aumente entre 25 y 35 libras (11 16 kg) hacia el final del embarazo.  Podrn aparecer las primeras estras en las caderas, abdomen y mamas.  Tendr necesidad de orinar con ms frecuencia porque el feto baja hacia la pelvis y presiona en la vejiga.  Como consecuencia del embarazo, podr sentir acidez estomacal continuamente.  Podr estar constipada ya que ciertas hormonas hacen que los msculos que hacen progresar los desechos a travs de los intestinos trabajen ms lentamente.  Pueden aparecer hemorroides o abultarse e hincharse las venas (venas varicosas).  Podr sentir dolor plvico debido al aumento de peso ya que las hormonas del embarazo relajan las articulaciones entre los huesos de la pelvis. El dolor de espalda puede ser consecuencia de la exigencia de los msculos que soportan la postura.  Sus mamas seguirn desarrollndose y estarn ms sensibles. A veces sale una secrecin amarilla de las mamas, que se llama calostro.  El ombligo puede salir hacia afuera.  Podr sentir que le falta el aire debido a que se expande el tero.  Podr notar que el feto "baja" o que se siente ms bajo en el abdomen.  Podr tener una prdida de secrecin mucosa con sangre. Esto suele ocurrir entre unos pocos das y una semana antes del parto.  El cuello se vuelve delgado y blando (se borra) cerca de la fecha de parto. QU DEBE ESPERAR EN LAS CONSULTAS  PRENATALES  Le harn exmenes prenatales cada 2 semanas hasta la semana 36. A partir de ese momento le harn exmenes semanales. Durante una visita prenatal de rutina:   La pesarn para verificar que usted y el feto se encuentran dentro de los lmites normales.  Le tomarn la presin arterial.  Le medirn el abdomen para verificar el desarrollo del beb.  Escucharn los latidos fetales.  Se evaluarn los resultados de los estudios solicitados en visitas anteriores.  Le controlarn el cuello del tero cuando est prxima la fecha de parto para ver si se ha borrado. Alrededor de la semana 36 el mdico controlar el cuello del tero. Al mismo tiempo realizar un anlisis de las secreciones del tejido vaginal. Este examen es para determinar si hay un tipo de bacteria, estreptococo Grupo B. El mdico le explicar esto con ms detalle.  El mdico podr preguntarle:   Como le gustara que fuera el parto.  Cmo se siente.  Si siente los movimientos del beb.  Si tiene sntomas anormales, como prdida de lquido, sangrado, dolores de cabeza intenso o clicos abdominales.  Si tiene alguna duda. Otros estudios que podrn realizarse durante el tercer trimestre son:   Anlisis de sangre para controlar sus niveles de hierro (anemia).  Controles fetales para determinar su salud, el nivel de actividad y su desarrollo. Si tiene alguna enfermedad o si tuvo problemas durante el embarazo, le harn estudios. FALSO TRABAJO DE PARTO  Es posible que sienta contracciones pequeas e irregulares que finalmente   desaparecen. Se llaman contracciones de Braxton Hicks o falso trabajo de parto. Las contracciones pueden durar horas, das o an semanas antes de que el verdadero trabajo de parto se inicie. Si las contracciones tienen intervalos regulares, se intensifican o se hacen dolorosas, lo mejor es que la revise su mdico.  SIGNOS DE TRABAJO DE PARTO   Espasmos del tipo menstrual.  Contracciones cada 5  minutos o menos.  Contracciones que comienzan en la parte superior del tero y se expanden hacia abajo, a la zona inferior del abdomen y la espalda.  Sensacin de presin que aumenta en la pelvis o dolor en la espalda.  Aparece una secrecin acuosa o sanguinolenta por la vagina. Si tiene alguno de estos signos antes de la semana 37 del embarazo, llame a su mdico inmediatamente. Debe concurrir al hospital para ser controlada inmediatamente.  INSTRUCCIONES PARA EL CUIDADO EN EL HOGAR   Evite fumar, consumir hierbas, beber alcohol y utilizar frmacos que no le hayan recetado. Estas sustancias qumicas afectan la formacin y el desarrollo del beb.  Siga las indicaciones del profesional con respecto a como tomar los medicamentos. Durante el embarazo, hay medicamentos que son seguros y otros no lo son.  Realice actividad fsica slo segn las indicaciones del mdico. Sentir clicos uterinos es el mejor signo para detener la actividad fsica.  Contine haciendo comidas regulares y sanas.  Use un sostn que le brinde buen soporte si sus mamas estn sensibles.  No utilice la baera con agua caliente, baos turcos o saunas.  Colquese el cinturn de seguridad cuando conduzca.  Evite comer carne cruda queso sin cocinar y el contacto con los utensilios y desperdicios de los gatos. Estos elementos contienen grmenes que pueden causar defectos de nacimiento en el beb.  Tome las vitaminas indicadas para la etapa prenatal.  Pruebe un laxante (si el mdico la autoriza) si tiene constipacin. Consuma ms alimentos ricos en fibra, como vegetales y frutas frescos y cereales enteros. Beba gran cantidad de lquido para mantener la orina de tono claro o amarillo plido.  Tome baos de agua tibia para calmar el dolor o las molestias causadas por las hemorroides. Use una crema para las hemorroides si el mdico la autoriza.  Si tiene venas varicosas, use medias de soporte. Eleve los pies durante 15 minutos,  3 4 veces por da. Limite el consumo de sal en su dieta.  Evite levantar objetos pesados, use zapatos de tacones bajos y mantenga una buena postura.  Descanse con las piernas elevadas si tiene calambres o dolor de cintura.  Visite a su dentista si no lo ha hecho durante el embarazo. Use un cepillo de dientes blando para higienizarse los dientes y use suavemente el hilo dental.  Puede continuar su vida sexual excepto que el mdico le indique otra cosa.  No haga viajes largos excepto que sea absolutamente necesario y slo con la aprobacin de su mdico.  Tome clases prenatales para entender, practicar y hacer preguntas sobre el trabajo de parto y el alumbramiento.  Haga un ensayo sobre la partida al hospital.  Prepare el bolso que llevar al hospital.  Prepare la habitacin del beb.  Contine concurriendo a todas las visitas prenatales segn las indicaciones de su mdico. SOLICITE ATENCIN MDICA SI:   No est segura si est en trabajo de parto o ha roto la bolsa de aguas.  Tiene mareos.  Siente clicos leves, presin en la pelvis o dolor persistente en el abdomen.  Tiene nuseas o vmitos persistentes.  Observa una   secrecin vaginal con mal olor.  Siente dolor al orinar. SOLICITE ATENCIN MDICA DE INMEDIATO SI:   Tiene fiebre.  Pierde lquido o sangre por la vagina.  Tiene sangrado o pequeas prdidas vaginales.  Siente dolor intenso o clicos en el abdomen.  Sube o baja de peso rpidamente.  Le falta el aire y le duele el pecho al respirar.  Sbitamente se le hincha el rostro, las manos, los tobillos, los pies o las piernas de manera extrema.  No ha sentido los movimientos del beb durante una hora.  Siente un dolor de cabeza intenso que no se alivia con medicamentos.  Su visin se modifica. Document Released: 06/14/2005 Document Revised: 05/07/2013 ExitCare Patient Information 2014 [employer].  

## 2013-11-24 ENCOUNTER — Ambulatory Visit (HOSPITAL_COMMUNITY): Payer: No Typology Code available for payment source

## 2013-11-26 ENCOUNTER — Ambulatory Visit (INDEPENDENT_AMBULATORY_CARE_PROVIDER_SITE_OTHER): Payer: Medicaid Other | Admitting: Obstetrics and Gynecology

## 2013-11-26 VITALS — BP 115/60 | Temp 98.0°F | Wt 168.7 lb

## 2013-11-26 DIAGNOSIS — O0932 Supervision of pregnancy with insufficient antenatal care, second trimester: Secondary | ICD-10-CM

## 2013-11-26 DIAGNOSIS — O0930 Supervision of pregnancy with insufficient antenatal care, unspecified trimester: Secondary | ICD-10-CM

## 2013-11-26 LAB — POCT URINALYSIS DIP (DEVICE)
Bilirubin Urine: NEGATIVE
Glucose, UA: NEGATIVE mg/dL
Ketones, ur: NEGATIVE mg/dL
Nitrite: NEGATIVE
PROTEIN: NEGATIVE mg/dL
SPECIFIC GRAVITY, URINE: 1.02 (ref 1.005–1.030)
UROBILINOGEN UA: 0.2 mg/dL (ref 0.0–1.0)
pH: 7 (ref 5.0–8.0)

## 2013-11-26 NOTE — Patient Instructions (Signed)
Tercer trimestre del embarazo  (Third Trimester of Pregnancy) El tercer trimestre del embarazo abarca desde la semana 29 hasta la semana 42, desde el 7 mes hasta el 9. En este trimestre el feto se desarrolla muy rpidamente. Hacia el final del noveno mes, el beb que an no ha nacido mide alrededor de 20 pulgadas (45 cm) de largo y pesa entre 6 y 10 libras (2,700 y 4,500 kg).  CAMBIOS CORPORALES  Su organismo atravesar numerosos cambios durante el embarazo. Los cambios varan de una mujer a otra.   Seguir aumentando de peso. Es esperable que aumente entre 25 y 35 libras (11 16 kg) hacia el final del embarazo.  Podrn aparecer las primeras estras en las caderas, abdomen y mamas.  Tendr necesidad de orinar con ms frecuencia porque el feto baja hacia la pelvis y presiona en la vejiga.  Como consecuencia del embarazo, podr sentir acidez estomacal continuamente.  Podr estar constipada ya que ciertas hormonas hacen que los msculos que hacen progresar los desechos a travs de los intestinos trabajen ms lentamente.  Pueden aparecer hemorroides o abultarse e hincharse las venas (venas varicosas).  Podr sentir dolor plvico debido al aumento de peso ya que las hormonas del embarazo relajan las articulaciones entre los huesos de la pelvis. El dolor de espalda puede ser consecuencia de la exigencia de los msculos que soportan la postura.  Sus mamas seguirn desarrollndose y estarn ms sensibles. A veces sale una secrecin amarilla de las mamas, que se llama calostro.  El ombligo puede salir hacia afuera.  Podr sentir que le falta el aire debido a que se expande el tero.  Podr notar que el feto "baja" o que se siente ms bajo en el abdomen.  Podr tener una prdida de secrecin mucosa con sangre. Esto suele ocurrir entre unos pocos das y una semana antes del parto.  El cuello se vuelve delgado y blando (se borra) cerca de la fecha de parto. QU DEBE ESPERAR EN LAS CONSULTAS  PRENATALES  Le harn exmenes prenatales cada 2 semanas hasta la semana 36. A partir de ese momento le harn exmenes semanales. Durante una visita prenatal de rutina:   La pesarn para verificar que usted y el feto se encuentran dentro de los lmites normales.  Le tomarn la presin arterial.  Le medirn el abdomen para verificar el desarrollo del beb.  Escucharn los latidos fetales.  Se evaluarn los resultados de los estudios solicitados en visitas anteriores.  Le controlarn el cuello del tero cuando est prxima la fecha de parto para ver si se ha borrado. Alrededor de la semana 36 el mdico controlar el cuello del tero. Al mismo tiempo realizar un anlisis de las secreciones del tejido vaginal. Este examen es para determinar si hay un tipo de bacteria, estreptococo Grupo B. El mdico le explicar esto con ms detalle.  El mdico podr preguntarle:   Como le gustara que fuera el parto.  Cmo se siente.  Si siente los movimientos del beb.  Si tiene sntomas anormales, como prdida de lquido, sangrado, dolores de cabeza intenso o clicos abdominales.  Si tiene alguna duda. Otros estudios que podrn realizarse durante el tercer trimestre son:   Anlisis de sangre para controlar sus niveles de hierro (anemia).  Controles fetales para determinar su salud, el nivel de actividad y su desarrollo. Si tiene alguna enfermedad o si tuvo problemas durante el embarazo, le harn estudios. FALSO TRABAJO DE PARTO  Es posible que sienta contracciones pequeas e irregulares que finalmente   desaparecen. Se llaman contracciones de Braxton Hicks o falso trabajo de parto. Las contracciones pueden durar horas, das o an semanas antes de que el verdadero trabajo de parto se inicie. Si las contracciones tienen intervalos regulares, se intensifican o se hacen dolorosas, lo mejor es que la revise su mdico.  SIGNOS DE TRABAJO DE PARTO   Espasmos del tipo menstrual.  Contracciones cada 5  minutos o menos.  Contracciones que comienzan en la parte superior del tero y se expanden hacia abajo, a la zona inferior del abdomen y la espalda.  Sensacin de presin que aumenta en la pelvis o dolor en la espalda.  Aparece una secrecin acuosa o sanguinolenta por la vagina. Si tiene alguno de estos signos antes de la semana 37 del embarazo, llame a su mdico inmediatamente. Debe concurrir al hospital para ser controlada inmediatamente.  INSTRUCCIONES PARA EL CUIDADO EN EL HOGAR   Evite fumar, consumir hierbas, beber alcohol y utilizar frmacos que no le hayan recetado. Estas sustancias qumicas afectan la formacin y el desarrollo del beb.  Siga las indicaciones del profesional con respecto a como tomar los medicamentos. Durante el embarazo, hay medicamentos que son seguros y otros no lo son.  Realice actividad fsica slo segn las indicaciones del mdico. Sentir clicos uterinos es el mejor signo para detener la actividad fsica.  Contine haciendo comidas regulares y sanas.  Use un sostn que le brinde buen soporte si sus mamas estn sensibles.  No utilice la baera con agua caliente, baos turcos o saunas.  Colquese el cinturn de seguridad cuando conduzca.  Evite comer carne cruda queso sin cocinar y el contacto con los utensilios y desperdicios de los gatos. Estos elementos contienen grmenes que pueden causar defectos de nacimiento en el beb.  Tome las vitaminas indicadas para la etapa prenatal.  Pruebe un laxante (si el mdico la autoriza) si tiene constipacin. Consuma ms alimentos ricos en fibra, como vegetales y frutas frescos y cereales enteros. Beba gran cantidad de lquido para mantener la orina de tono claro o amarillo plido.  Tome baos de agua tibia para calmar el dolor o las molestias causadas por las hemorroides. Use una crema para las hemorroides si el mdico la autoriza.  Si tiene venas varicosas, use medias de soporte. Eleve los pies durante 15 minutos,  3 4 veces por da. Limite el consumo de sal en su dieta.  Evite levantar objetos pesados, use zapatos de tacones bajos y mantenga una buena postura.  Descanse con las piernas elevadas si tiene calambres o dolor de cintura.  Visite a su dentista si no lo ha hecho durante el embarazo. Use un cepillo de dientes blando para higienizarse los dientes y use suavemente el hilo dental.  Puede continuar su vida sexual excepto que el mdico le indique otra cosa.  No haga viajes largos excepto que sea absolutamente necesario y slo con la aprobacin de su mdico.  Tome clases prenatales para entender, practicar y hacer preguntas sobre el trabajo de parto y el alumbramiento.  Haga un ensayo sobre la partida al hospital.  Prepare el bolso que llevar al hospital.  Prepare la habitacin del beb.  Contine concurriendo a todas las visitas prenatales segn las indicaciones de su mdico. SOLICITE ATENCIN MDICA SI:   No est segura si est en trabajo de parto o ha roto la bolsa de aguas.  Tiene mareos.  Siente clicos leves, presin en la pelvis o dolor persistente en el abdomen.  Tiene nuseas o vmitos persistentes.  Observa una   secrecin vaginal con mal olor.  Siente dolor al orinar. SOLICITE ATENCIN MDICA DE INMEDIATO SI:   Tiene fiebre.  Pierde lquido o sangre por la vagina.  Tiene sangrado o pequeas prdidas vaginales.  Siente dolor intenso o clicos en el abdomen.  Sube o baja de peso rpidamente.  Le falta el aire y le duele el pecho al respirar.  Sbitamente se le hincha el rostro, las manos, los tobillos, los pies o las piernas de manera extrema.  No ha sentido los movimientos del beb durante una hora.  Siente un dolor de cabeza intenso que no se alivia con medicamentos.  Su visin se modifica. Document Released: 06/14/2005 Document Revised: 05/07/2013 ExitCare Patient Information 2014 [employer].  

## 2013-11-26 NOTE — Progress Notes (Signed)
P= 86 C/o of intermittent lower abdominal/pelvic pressure.

## 2013-11-26 NOTE — Progress Notes (Signed)
Complaints of being tired. She currently works at [employer]. She pushes up to 25lbs but does not lift. She is having intermittent pelvic pain/pressure 1-2 times per day. She is having some transparent vaginal discharge. No leaking of fluid. No bloody discharge. Good fetal movement.

## 2013-12-10 ENCOUNTER — Ambulatory Visit (INDEPENDENT_AMBULATORY_CARE_PROVIDER_SITE_OTHER): Payer: Medicaid Other | Admitting: Advanced Practice Midwife

## 2013-12-10 VITALS — BP 115/62 | Temp 98.2°F | Wt 171.9 lb

## 2013-12-10 DIAGNOSIS — O0930 Supervision of pregnancy with insufficient antenatal care, unspecified trimester: Secondary | ICD-10-CM

## 2013-12-10 DIAGNOSIS — O0932 Supervision of pregnancy with insufficient antenatal care, second trimester: Secondary | ICD-10-CM

## 2013-12-10 LAB — POCT URINALYSIS DIP (DEVICE)
BILIRUBIN URINE: NEGATIVE
GLUCOSE, UA: NEGATIVE mg/dL
KETONES UR: NEGATIVE mg/dL
NITRITE: NEGATIVE
PH: 7 (ref 5.0–8.0)
Protein, ur: NEGATIVE mg/dL
Specific Gravity, Urine: 1.02 (ref 1.005–1.030)
Urobilinogen, UA: 0.2 mg/dL (ref 0.0–1.0)

## 2013-12-10 LAB — OB RESULTS CONSOLE GBS: GBS: NEGATIVE

## 2013-12-10 NOTE — Progress Notes (Signed)
Doing well. Cultures and GBS done. Declines cervix exam. Wants to stop work next week. Wants to wait till next week to get her letter.

## 2013-12-10 NOTE — Progress Notes (Signed)
P= 92 C/o of intermittent lower abdominal/pelvic pressure and braxton hicks.

## 2013-12-10 NOTE — Patient Instructions (Signed)

## 2013-12-11 LAB — GC/CHLAMYDIA PROBE AMP
CT Probe RNA: NEGATIVE
GC Probe RNA: NEGATIVE

## 2013-12-13 LAB — CULTURE, STREPTOCOCCUS GRP B W/SUSCEPT

## 2013-12-17 ENCOUNTER — Ambulatory Visit (INDEPENDENT_AMBULATORY_CARE_PROVIDER_SITE_OTHER): Payer: Medicaid Other | Admitting: Advanced Practice Midwife

## 2013-12-17 ENCOUNTER — Encounter: Payer: Self-pay | Admitting: Advanced Practice Midwife

## 2013-12-17 ENCOUNTER — Encounter: Payer: Self-pay | Admitting: Obstetrics & Gynecology

## 2013-12-17 VITALS — BP 108/62 | Temp 97.3°F | Wt 174.9 lb

## 2013-12-17 DIAGNOSIS — O0932 Supervision of pregnancy with insufficient antenatal care, second trimester: Secondary | ICD-10-CM

## 2013-12-17 DIAGNOSIS — O0930 Supervision of pregnancy with insufficient antenatal care, unspecified trimester: Secondary | ICD-10-CM

## 2013-12-17 LAB — POCT URINALYSIS DIP (DEVICE)
BILIRUBIN URINE: NEGATIVE
Glucose, UA: NEGATIVE mg/dL
KETONES UR: NEGATIVE mg/dL
Nitrite: NEGATIVE
PH: 7 (ref 5.0–8.0)
Protein, ur: NEGATIVE mg/dL
Specific Gravity, Urine: 1.015 (ref 1.005–1.030)
Urobilinogen, UA: 0.2 mg/dL (ref 0.0–1.0)

## 2013-12-17 NOTE — Progress Notes (Signed)
Pulse- 100 Patient reports pelvic & back pain/pressure

## 2013-12-17 NOTE — Patient Instructions (Signed)

## 2013-12-19 NOTE — Progress Notes (Signed)
Doing well but ready to stop work. Note given

## 2013-12-24 ENCOUNTER — Ambulatory Visit (INDEPENDENT_AMBULATORY_CARE_PROVIDER_SITE_OTHER): Payer: Medicaid Other | Admitting: Family Medicine

## 2013-12-24 VITALS — BP 112/68 | Temp 98.3°F | Wt 175.9 lb

## 2013-12-24 DIAGNOSIS — O0932 Supervision of pregnancy with insufficient antenatal care, second trimester: Secondary | ICD-10-CM

## 2013-12-24 DIAGNOSIS — O0930 Supervision of pregnancy with insufficient antenatal care, unspecified trimester: Secondary | ICD-10-CM

## 2013-12-24 DIAGNOSIS — R8271 Bacteriuria: Secondary | ICD-10-CM

## 2013-12-24 DIAGNOSIS — O99891 Other specified diseases and conditions complicating pregnancy: Secondary | ICD-10-CM

## 2013-12-24 DIAGNOSIS — O9989 Other specified diseases and conditions complicating pregnancy, childbirth and the puerperium: Secondary | ICD-10-CM

## 2013-12-24 LAB — POCT URINALYSIS DIP (DEVICE)
Bilirubin Urine: NEGATIVE
GLUCOSE, UA: NEGATIVE mg/dL
KETONES UR: NEGATIVE mg/dL
Nitrite: NEGATIVE
PROTEIN: 30 mg/dL — AB
SPECIFIC GRAVITY, URINE: 1.02 (ref 1.005–1.030)
UROBILINOGEN UA: 0.2 mg/dL (ref 0.0–1.0)
pH: 8.5 — ABNORMAL HIGH (ref 5.0–8.0)

## 2013-12-24 MED ORDER — PRENATAL 27-1 MG PO TABS: 1.0000 | ORAL_TABLET | Freq: Every day | ORAL | Status: AC

## 2013-12-24 NOTE — Progress Notes (Signed)
S: 23 yo G1 @ [id number] here for ROBV - doing well.  - has questions about circumcision and where to get it done - no regular ctx, no lof,vb. +FM  O: see flowsheet  A/P - doing well - discussed labor precautions - f/u in 1 week for ROBV and NST/AFI for postdates at that time - mirena for contraception

## 2013-12-24 NOTE — Patient Instructions (Signed)
Tercer trimestre del embarazo  (Third Trimester of Pregnancy) El tercer trimestre del embarazo abarca desde la semana 29 hasta la semana 42, desde el 7 mes hasta el 9. En este trimestre el feto se desarrolla muy rpidamente. Hacia el final del noveno mes, el beb que an no ha nacido mide alrededor de 20 pulgadas (45 cm) de largo y pesa entre 6 y 10 libras (2,700 y 4,500 kg).  CAMBIOS CORPORALES  Su organismo atravesar numerosos cambios durante el embarazo. Los cambios varan de una mujer a otra.   Seguir aumentando de peso. Es esperable que aumente entre 25 y 35 libras (11 16 kg) hacia el final del embarazo.  Podrn aparecer las primeras estras en las caderas, abdomen y mamas.  Tendr necesidad de orinar con ms frecuencia porque el feto baja hacia la pelvis y presiona en la vejiga.  Como consecuencia del embarazo, podr sentir acidez estomacal continuamente.  Podr estar constipada ya que ciertas hormonas hacen que los msculos que hacen progresar los desechos a travs de los intestinos trabajen ms lentamente.  Pueden aparecer hemorroides o abultarse e hincharse las venas (venas varicosas).  Podr sentir dolor plvico debido al aumento de peso ya que las hormonas del embarazo relajan las articulaciones entre los huesos de la pelvis. El dolor de espalda puede ser consecuencia de la exigencia de los msculos que soportan la postura.  Sus mamas seguirn desarrollndose y estarn ms sensibles. A veces sale una secrecin amarilla de las mamas, que se llama calostro.  El ombligo puede salir hacia afuera.  Podr sentir que le falta el aire debido a que se expande el tero.  Podr notar que el feto "baja" o que se siente ms bajo en el abdomen.  Podr tener una prdida de secrecin mucosa con sangre. Esto suele ocurrir entre unos pocos das y una semana antes del parto.  El cuello se vuelve delgado y blando (se borra) cerca de la fecha de parto. QU DEBE ESPERAR EN LAS CONSULTAS  PRENATALES  Le harn exmenes prenatales cada 2 semanas hasta la semana 36. A partir de ese momento le harn exmenes semanales. Durante una visita prenatal de rutina:   La pesarn para verificar que usted y el feto se encuentran dentro de los lmites normales.  Le tomarn la presin arterial.  Le medirn el abdomen para verificar el desarrollo del beb.  Escucharn los latidos fetales.  Se evaluarn los resultados de los estudios solicitados en visitas anteriores.  Le controlarn el cuello del tero cuando est prxima la fecha de parto para ver si se ha borrado. Alrededor de la semana 36 el mdico controlar el cuello del tero. Al mismo tiempo realizar un anlisis de las secreciones del tejido vaginal. Este examen es para determinar si hay un tipo de bacteria, estreptococo Grupo B. El mdico le explicar esto con ms detalle.  El mdico podr preguntarle:   Como le gustara que fuera el parto.  Cmo se siente.  Si siente los movimientos del beb.  Si tiene sntomas anormales, como prdida de lquido, sangrado, dolores de cabeza intenso o clicos abdominales.  Si tiene alguna duda. Otros estudios que podrn realizarse durante el tercer trimestre son:   Anlisis de sangre para controlar sus niveles de hierro (anemia).  Controles fetales para determinar su salud, el nivel de actividad y su desarrollo. Si tiene alguna enfermedad o si tuvo problemas durante el embarazo, le harn estudios. FALSO TRABAJO DE PARTO  Es posible que sienta contracciones pequeas e irregulares que finalmente   desaparecen. Se llaman contracciones de Braxton Hicks o falso trabajo de parto. Las contracciones pueden durar horas, das o an semanas antes de que el verdadero trabajo de parto se inicie. Si las contracciones tienen intervalos regulares, se intensifican o se hacen dolorosas, lo mejor es que la revise su mdico.  SIGNOS DE TRABAJO DE PARTO   Espasmos del tipo menstrual.  Contracciones cada 5  minutos o menos.  Contracciones que comienzan en la parte superior del tero y se expanden hacia abajo, a la zona inferior del abdomen y la espalda.  Sensacin de presin que aumenta en la pelvis o dolor en la espalda.  Aparece una secrecin acuosa o sanguinolenta por la vagina. Si tiene alguno de estos signos antes de la semana 37 del embarazo, llame a su mdico inmediatamente. Debe concurrir al hospital para ser controlada inmediatamente.  INSTRUCCIONES PARA EL CUIDADO EN EL HOGAR   Evite fumar, consumir hierbas, beber alcohol y utilizar frmacos que no le hayan recetado. Estas sustancias qumicas afectan la formacin y el desarrollo del beb.  Siga las indicaciones del profesional con respecto a como tomar los medicamentos. Durante el embarazo, hay medicamentos que son seguros y otros no lo son.  Realice actividad fsica slo segn las indicaciones del mdico. Sentir clicos uterinos es el mejor signo para detener la actividad fsica.  Contine haciendo comidas regulares y sanas.  Use un sostn que le brinde buen soporte si sus mamas estn sensibles.  No utilice la baera con agua caliente, baos turcos o saunas.  Colquese el cinturn de seguridad cuando conduzca.  Evite comer carne cruda queso sin cocinar y el contacto con los utensilios y desperdicios de los gatos. Estos elementos contienen grmenes que pueden causar defectos de nacimiento en el beb.  Tome las vitaminas indicadas para la etapa prenatal.  Pruebe un laxante (si el mdico la autoriza) si tiene constipacin. Consuma ms alimentos ricos en fibra, como vegetales y frutas frescos y cereales enteros. Beba gran cantidad de lquido para mantener la orina de tono claro o amarillo plido.  Tome baos de agua tibia para calmar el dolor o las molestias causadas por las hemorroides. Use una crema para las hemorroides si el mdico la autoriza.  Si tiene venas varicosas, use medias de soporte. Eleve los pies durante 15 minutos,  3 4 veces por da. Limite el consumo de sal en su dieta.  Evite levantar objetos pesados, use zapatos de tacones bajos y mantenga una buena postura.  Descanse con las piernas elevadas si tiene calambres o dolor de cintura.  Visite a su dentista si no lo ha hecho durante el embarazo. Use un cepillo de dientes blando para higienizarse los dientes y use suavemente el hilo dental.  Puede continuar su vida sexual excepto que el mdico le indique otra cosa.  No haga viajes largos excepto que sea absolutamente necesario y slo con la aprobacin de su mdico.  Tome clases prenatales para entender, practicar y hacer preguntas sobre el trabajo de parto y el alumbramiento.  Haga un ensayo sobre la partida al hospital.  Prepare el bolso que llevar al hospital.  Prepare la habitacin del beb.  Contine concurriendo a todas las visitas prenatales segn las indicaciones de su mdico. SOLICITE ATENCIN MDICA SI:   No est segura si est en trabajo de parto o ha roto la bolsa de aguas.  Tiene mareos.  Siente clicos leves, presin en la pelvis o dolor persistente en el abdomen.  Tiene nuseas o vmitos persistentes.  Observa una   secrecin vaginal con mal olor.  Siente dolor al orinar. SOLICITE ATENCIN MDICA DE INMEDIATO SI:   Tiene fiebre.  Pierde lquido o sangre por la vagina.  Tiene sangrado o pequeas prdidas vaginales.  Siente dolor intenso o clicos en el abdomen.  Sube o baja de peso rpidamente.  Le falta el aire y le duele el pecho al respirar.  Sbitamente se le hincha el rostro, las manos, los tobillos, los pies o las piernas de manera extrema.  No ha sentido los movimientos del beb durante una hora.  Siente un dolor de cabeza intenso que no se alivia con medicamentos.  Su visin se modifica. Document Released: 06/14/2005 Document Revised: 05/07/2013 ExitCare Patient Information 2014 [employer].  

## 2013-12-24 NOTE — Progress Notes (Signed)
Pulse: 91

## 2013-12-24 NOTE — Addendum Note (Signed)
Addended by: Jill SideAY, Thea Holshouser L on: 12/24/2013 11:59 AM   Modules accepted: Orders

## 2013-12-26 LAB — CULTURE, OB URINE
Colony Count: NO GROWTH
ORGANISM ID, BACTERIA: NO GROWTH

## 2013-12-31 ENCOUNTER — Ambulatory Visit (INDEPENDENT_AMBULATORY_CARE_PROVIDER_SITE_OTHER): Payer: Medicaid Other | Admitting: Family Medicine

## 2013-12-31 ENCOUNTER — Encounter: Payer: Self-pay | Admitting: Family Medicine

## 2013-12-31 VITALS — BP 120/55

## 2013-12-31 DIAGNOSIS — O0930 Supervision of pregnancy with insufficient antenatal care, unspecified trimester: Secondary | ICD-10-CM

## 2013-12-31 DIAGNOSIS — O0932 Supervision of pregnancy with insufficient antenatal care, second trimester: Secondary | ICD-10-CM

## 2013-12-31 DIAGNOSIS — O480 Post-term pregnancy: Secondary | ICD-10-CM

## 2013-12-31 LAB — US OB FOLLOW UP

## 2013-12-31 LAB — POCT URINALYSIS DIP (DEVICE)
BILIRUBIN URINE: NEGATIVE
Glucose, UA: NEGATIVE mg/dL
Hgb urine dipstick: NEGATIVE
KETONES UR: NEGATIVE mg/dL
Nitrite: NEGATIVE
PROTEIN: NEGATIVE mg/dL
Urobilinogen, UA: 0.2 mg/dL (ref 0.0–1.0)
pH: 5.5 (ref 5.0–8.0)

## 2013-12-31 NOTE — Progress Notes (Signed)
P = 91   Pt reports light bleeding on 4/12.  Pt declines IOL @ 41 wks- will continue 2x/wk fetal testing

## 2013-12-31 NOTE — Patient Instructions (Addendum)
Offices that do circumcisions:  [facility]([place]): [phone] $150 within 4 weeks of birth [facility] [phone] [facility]([place]) [phone] within 4 weeks of birth,  [facility] [phone] [facility]([place]) $250 within 7 days of birth, [facility] [phone] [facility]([place]) $175 within 2 weeks of birth Third Trimester of Pregnancy The third trimester is from week 29 through week 42, months 7 through 9. The third trimester is a time when the fetus is growing rapidly. At the end of the ninth month, the fetus is about 20 inches in length and weighs 6 10 pounds.  BODY CHANGES Your body goes through many changes during pregnancy. The changes vary from woman to woman.   Your weight will continue to increase. You can expect to gain 25 35 pounds (11 16 kg) by the end of the pregnancy.  You may begin to get stretch marks on your hips, abdomen, and breasts.  You may urinate more often because the fetus is moving lower into your pelvis and pressing on your bladder.  You may develop or continue to have heartburn as a result of your pregnancy.  You may develop constipation because certain hormones are causing the muscles that push waste through your intestines to slow down.  You may develop hemorrhoids or swollen, bulging veins (varicose veins).  You may have pelvic pain because of the weight gain and pregnancy hormones relaxing your joints between the bones in your pelvis. Back aches may result from over exertion of the muscles supporting your posture.  Your breasts will continue to grow and be tender. A yellow discharge may leak from your breasts called colostrum.  Your belly button may stick out.  You may feel short of breath because of your expanding uterus.  You may notice the fetus "dropping," or moving lower in your abdomen.  You may have a bloody mucus discharge. This usually occurs a few days to a week before labor begins.  Your cervix becomes thin and soft (effaced) near  your due date. WHAT TO EXPECT AT YOUR PRENATAL EXAMS  You will have prenatal exams every 2 weeks until week 36. Then, you will have weekly prenatal exams. During a routine prenatal visit:  You will be weighed to make sure you and the fetus are growing normally.  Your blood pressure is taken.  Your abdomen will be measured to track your baby's growth.  The fetal heartbeat will be listened to.  Any test results from the previous visit will be discussed.  You may have a cervical check near your due date to see if you have effaced. At around 36 weeks, your caregiver will check your cervix. At the same time, your caregiver will also perform a test on the secretions of the vaginal tissue. This test is to determine if a type of bacteria, Group B streptococcus, is present. Your caregiver will explain this further. Your caregiver may ask you:  What your birth plan is.  How you are feeling.  If you are feeling the baby move.  If you have had any abnormal symptoms, such as leaking fluid, bleeding, severe headaches, or abdominal cramping.  If you have any questions. Other tests or screenings that may be performed during your third trimester include:  Blood tests that check for low iron levels (anemia).  Fetal testing to check the health, activity level, and growth of the fetus. Testing is done if you have certain medical conditions or if there are problems during the pregnancy. FALSE LABOR You may feel small, irregular  contractions that eventually go away. These are called Braxton Hicks contractions, or false labor. Contractions may last for hours, days, or even weeks before true labor sets in. If contractions come at regular intervals, intensify, or become painful, it is best to be seen by your caregiver.  SIGNS OF LABOR   Menstrual-like cramps.  Contractions that are 5 minutes apart or less.  Contractions that start on the top of the uterus and spread down to the lower abdomen and  back.  A sense of increased pelvic pressure or back pain.  A watery or bloody mucus discharge that comes from the vagina. If you have any of these signs before the 37th week of pregnancy, call your caregiver right away. You need to go to the hospital to get checked immediately. HOME CARE INSTRUCTIONS   Avoid all smoking, herbs, alcohol, and unprescribed drugs. These chemicals affect the formation and growth of the baby.  Follow your caregiver's instructions regarding medicine use. There are medicines that are either safe or unsafe to take during pregnancy.  Exercise only as directed by your caregiver. Experiencing uterine cramps is a good sign to stop exercising.  Continue to eat regular, healthy meals.  Wear a good support bra for breast tenderness.  Do not use hot tubs, steam rooms, or saunas.  Wear your seat belt at all times when driving.  Avoid raw meat, uncooked cheese, cat litter boxes, and soil used by cats. These carry germs that can cause birth defects in the baby.  Take your prenatal vitamins.  Try taking a stool softener (if your caregiver approves) if you develop constipation. Eat more high-fiber foods, such as fresh vegetables or fruit and whole grains. Drink plenty of fluids to keep your urine clear or pale yellow.  Take warm sitz baths to soothe any pain or discomfort caused by hemorrhoids. Use hemorrhoid cream if your caregiver approves.  If you develop varicose veins, wear support hose. Elevate your feet for 15 minutes, 3 4 times a day. Limit salt in your diet.  Avoid heavy lifting, wear low heal shoes, and practice good posture.  Rest a lot with your legs elevated if you have leg cramps or low back pain.  Visit your dentist if you have not gone during your pregnancy. Use a soft toothbrush to brush your teeth and be gentle when you floss.  A sexual relationship may be continued unless your caregiver directs you otherwise.  Do not travel far distances unless  it is absolutely necessary and only with the approval of your caregiver.  Take prenatal classes to understand, practice, and ask questions about the labor and delivery.  Make a trial run to the hospital.  Pack your hospital bag.  Prepare the baby's nursery.  Continue to go to all your prenatal visits as directed by your caregiver. SEEK MEDICAL CARE IF:  You are unsure if you are in labor or if your water has broken.  You have dizziness.  You have mild pelvic cramps, pelvic pressure, or nagging pain in your abdominal area.  You have persistent nausea, vomiting, or diarrhea.  You have a bad smelling vaginal discharge.  You have pain with urination. SEEK IMMEDIATE MEDICAL CARE IF:   You have a fever.  You are leaking fluid from your vagina.  You have spotting or bleeding from your vagina.  You have severe abdominal cramping or pain.  You have rapid weight loss or gain.  You have shortness of breath with chest pain.  You notice sudden  or extreme swelling of your face, hands, ankles, feet, or legs.  You have not felt your baby move in over an hour.  You have severe headaches that do not go away with medicine.  You have vision changes. Document Released: 08/29/2001 Document Revised: 05/07/2013 Document Reviewed: 11/05/2012 [facility] Patient Information 2014 [place], [place].

## 2013-12-31 NOTE — Progress Notes (Signed)
Reactive and reassuring NST No ctx, normal FM, minimal bleeding 4/12. None since then. No LOF  Considering IOL after 41 wk. Readdress next visit Janice Barron is a 23 y.o. G1P0 at 4121w3d here for ROB visit.    Discussed with Patient:  - Plans to breast/bottle feed.  All questions answered. - Continue prenatal vitamins. - Reviewed fetal kick counts Pt to perform daily at a time when the baby is active, lie laterally with both hands on belly in quiet room and count all movements (hiccups, shoulder rolls, obvious kicks, etc); pt is to report to clinic MAU for less than 10 movements felt in a 2 hour time period-pt told as soon as she counts 10 movements the count is complete.  - RTC for any VB, regular, painful cramps/ctxs occurring at a rate of >2/10 min, fever (100.5 or higher), n/v/d, any pain that is unresolving or worsening, LOF, decreased fetal movement, CP, SOB, edema -RTC in Friday for NST Problems: Patient Active Problem List   Diagnosis Date Noted  . Late prenatal care complicating pregnancy in second trimester 08/28/2013

## 2014-01-02 ENCOUNTER — Encounter (HOSPITAL_COMMUNITY): Payer: Self-pay | Admitting: *Deleted

## 2014-01-02 ENCOUNTER — Telehealth (HOSPITAL_COMMUNITY): Payer: Self-pay | Admitting: *Deleted

## 2014-01-02 ENCOUNTER — Ambulatory Visit (INDEPENDENT_AMBULATORY_CARE_PROVIDER_SITE_OTHER): Payer: Medicaid Other | Admitting: *Deleted

## 2014-01-02 VITALS — BP 112/58

## 2014-01-02 DIAGNOSIS — O480 Post-term pregnancy: Secondary | ICD-10-CM

## 2014-01-02 NOTE — Progress Notes (Signed)
NST reviewed and reactive.  

## 2014-01-02 NOTE — Progress Notes (Signed)
P = 95  Pt now desires IOL on 4/22 for postdates - scheduled @ 0630, instructions given.

## 2014-01-02 NOTE — Telephone Encounter (Signed)
Preadmission screen  

## 2014-01-07 ENCOUNTER — Encounter (HOSPITAL_COMMUNITY): Payer: Self-pay

## 2014-01-07 ENCOUNTER — Inpatient Hospital Stay (HOSPITAL_COMMUNITY)
Admission: RE | Admit: 2014-01-07 | Discharge: 2014-01-09 | DRG: 775 | Disposition: A | Discharge status: Home / Self Care | Payer: Medicaid Other | Source: Ambulatory Visit | Attending: Obstetrics and Gynecology | Admitting: Obstetrics and Gynecology

## 2014-01-07 ENCOUNTER — Other Ambulatory Visit: Payer: Medicaid Other

## 2014-01-07 DIAGNOSIS — O480 Post-term pregnancy: Secondary | ICD-10-CM | POA: Diagnosis present

## 2014-01-07 DIAGNOSIS — Z8611 Personal history of tuberculosis: Secondary | ICD-10-CM

## 2014-01-07 DIAGNOSIS — Z833 Family history of diabetes mellitus: Secondary | ICD-10-CM

## 2014-01-07 LAB — CBC
HEMATOCRIT: 34.9 % — AB (ref 36.0–46.0)
HEMOGLOBIN: 12 g/dL (ref 12.0–15.0)
MCH: 32.4 pg (ref 26.0–34.0)
MCHC: 34.4 g/dL (ref 30.0–36.0)
MCV: 94.3 fL (ref 78.0–100.0)
Platelets: 203 10*3/uL (ref 150–400)
RBC: 3.7 MIL/uL — AB (ref 3.87–5.11)
RDW: 13.2 % (ref 11.5–15.5)
WBC: 8.7 10*3/uL (ref 4.0–10.5)

## 2014-01-07 LAB — TYPE AND SCREEN
ABO/RH(D): O POS
ANTIBODY SCREEN: NEGATIVE

## 2014-01-07 LAB — ABO/RH: ABO/RH(D): O POS

## 2014-01-07 LAB — RPR

## 2014-01-07 MED ORDER — OXYCODONE-ACETAMINOPHEN 5-325 MG PO TABS: 1.0000 | ORAL_TABLET | ORAL | Status: DC | PRN

## 2014-01-07 MED ORDER — LACTATED RINGERS IV SOLN
INTRAVENOUS | Status: DC
  Administered 2014-01-07 – 2014-01-08 (×4): via INTRAVENOUS

## 2014-01-07 MED ORDER — TERBUTALINE SULFATE 1 MG/ML IJ SOLN: 0.2500 mg | Freq: Once | INTRAMUSCULAR | Status: AC | PRN

## 2014-01-07 MED ORDER — EPHEDRINE 5 MG/ML INJ
10.0000 mg | INTRAVENOUS | Status: DC | PRN
  Filled 2014-01-07: qty 4

## 2014-01-07 MED ORDER — CITRIC ACID-SODIUM CITRATE 334-500 MG/5ML PO SOLN
30.0000 mL | ORAL | Status: DC | PRN
Start: 2014-01-07 — End: 2014-01-09

## 2014-01-07 MED ORDER — OXYTOCIN BOLUS FROM INFUSION: 500.0000 mL | INTRAVENOUS | Status: DC

## 2014-01-07 MED ORDER — LIDOCAINE HCL (PF) 1 % IJ SOLN
30.0000 mL | INTRAMUSCULAR | Status: AC | PRN
  Administered 2014-01-08 (×2): 30 mL via SUBCUTANEOUS
  Filled 2014-01-07 (×2): qty 30

## 2014-01-07 MED ORDER — PHENYLEPHRINE 40 MCG/ML (10ML) SYRINGE FOR IV PUSH (FOR BLOOD PRESSURE SUPPORT): 80.0000 ug | PREFILLED_SYRINGE | INTRAVENOUS | Status: DC | PRN

## 2014-01-07 MED ORDER — OXYTOCIN 40 UNITS IN LACTATED RINGERS INFUSION - SIMPLE MED
62.5000 mL/h | INTRAVENOUS | Status: DC
  Administered 2014-01-08: 62.5 mL/h via INTRAVENOUS
  Filled 2014-01-07: qty 1000

## 2014-01-07 MED ORDER — PHENYLEPHRINE 40 MCG/ML (10ML) SYRINGE FOR IV PUSH (FOR BLOOD PRESSURE SUPPORT)
80.0000 ug | PREFILLED_SYRINGE | INTRAVENOUS | Status: DC | PRN
  Filled 2014-01-07: qty 10

## 2014-01-07 MED ORDER — IBUPROFEN 600 MG PO TABS
600.0000 mg | ORAL_TABLET | Freq: Four times a day (QID) | ORAL | Status: DC | PRN
  Administered 2014-01-08: 600 mg via ORAL
  Filled 2014-01-07: qty 1

## 2014-01-07 MED ORDER — EPHEDRINE 5 MG/ML INJ: 10.0000 mg | INTRAVENOUS | Status: DC | PRN

## 2014-01-07 MED ORDER — MISOPROSTOL 25 MCG QUARTER TABLET
25.0000 ug | ORAL_TABLET | ORAL | Status: DC | PRN
Start: 2014-01-07 — End: 2014-01-09
  Administered 2014-01-07 (×2): 25 ug via VAGINAL
  Filled 2014-01-07: qty 0.25
  Filled 2014-01-07: qty 1
  Filled 2014-01-07: qty 0.25

## 2014-01-07 MED ORDER — LACTATED RINGERS IV SOLN: 500.0000 mL | INTRAVENOUS | Status: DC | PRN

## 2014-01-07 MED ORDER — DIPHENHYDRAMINE HCL 50 MG/ML IJ SOLN: 12.5000 mg | INTRAMUSCULAR | Status: DC | PRN

## 2014-01-07 MED ORDER — ONDANSETRON HCL 4 MG/2ML IJ SOLN: 4.0000 mg | Freq: Four times a day (QID) | INTRAMUSCULAR | Status: DC | PRN

## 2014-01-07 MED ORDER — ACETAMINOPHEN 325 MG PO TABS: 650.0000 mg | ORAL_TABLET | ORAL | Status: DC | PRN

## 2014-01-07 MED ORDER — FENTANYL 2.5 MCG/ML BUPIVACAINE 1/10 % EPIDURAL INFUSION (WH - ANES)
14.0000 mL/h | INTRAMUSCULAR | Status: DC | PRN
Start: 2014-01-07 — End: 2014-01-08
  Filled 2014-01-07: qty 125

## 2014-01-07 MED ORDER — LACTATED RINGERS IV SOLN
500.0000 mL | Freq: Once | INTRAVENOUS | Status: AC
  Administered 2014-01-08: 500 mL via INTRAVENOUS

## 2014-01-07 MED ORDER — FENTANYL CITRATE 0.05 MG/ML IJ SOLN
100.0000 ug | INTRAMUSCULAR | Status: DC | PRN
  Administered 2014-01-07 – 2014-01-08 (×5): 100 ug via INTRAVENOUS
  Filled 2014-01-07 (×5): qty 2

## 2014-01-07 NOTE — H&P (Signed)
`````  Attestation of Attending Supervision of Advanced Practitioner: Evaluation and management procedures were performed by the PA/NP/CNM/OB Fellow under my supervision/collaboration. Chart reviewed and agree with management and plan.  Tilda BurrowJohn V Sybol Morre 01/07/2014 2:02 PM

## 2014-01-07 NOTE — H&P (Signed)
Janice HarderSusanne Alvarez Barron is a 23 y.o. female presenting for IOL 2/2 post-dates. Maternal Medical History:  Reason for admission: Nausea.  Contractions: Frequency: rare.    Fetal activity: Perceived fetal activity is normal.      OB History   Grav Para Term Preterm Abortions TAB SAB Ect Mult Living   1              Past Medical History  Diagnosis Date  . Tuberculosis     patient had positive tb skin test earlier this year and took meds, had normal chest xray   Past Surgical History  Procedure Laterality Date  . No past surgeries     Family History: family history includes Cancer in her maternal grandfather; Diabetes in her mother; Hyperlipidemia in her mother. Social History:  reports that she has never smoked. She has never used smokeless tobacco. She reports that she does not drink alcohol or use illicit drugs.   Prenatal Transfer Tool  Maternal Diabetes: No Genetic Screening: Declined too late to care  Maternal Ultrasounds/Referrals: Normal Fetal Ultrasounds or other Referrals:  None Maternal Substance Abuse:  No Significant Maternal Medications:  None Significant Maternal Lab Results:  None Other Comments:  None  Review of Systems  Constitutional: Negative for fever.  Eyes: Negative for blurred vision.  Cardiovascular: Negative for chest pain.  Gastrointestinal: Negative for nausea, vomiting, abdominal pain, diarrhea and constipation.  Genitourinary: Negative for dysuria.  Neurological: Negative for dizziness and headaches.      Blood pressure 107/67, pulse 93, temperature 98.3 F (36.8 C), temperature source Oral, resp. rate 18, height 5' (1.524 m), weight 81.647 kg (180 lb), last menstrual period 04/06/2013. Maternal Exam:  Uterine Assessment: Contraction frequency is rare.   Cervix: Cervix evaluated by digital exam.     Fetal Exam Fetal Monitor Review: Mode: ultrasound.   Baseline rate: 145.  Variability: moderate (6-25 bpm).   Pattern: accelerations  present and no decelerations.    Fetal State Assessment: Category I - tracings are normal.     Physical Exam  Nursing note and vitals reviewed. Constitutional: She is oriented to person, place, and time. She appears well-developed and well-nourished. No distress.  Cardiovascular: Normal rate.   Respiratory: Effort normal.  GI: Soft. There is no tenderness.  Neurological: She is alert and oriented to person, place, and time.  Skin: Skin is warm and dry.  Psychiatric: She has a normal mood and affect.    Prenatal labs: ABO, Rh: O/POS/-- (12/11 1102) Antibody: NEG (12/11 1102) Rubella: 2.10 (12/11 1102) RPR: NON REAC (01/28 1007)  HBsAg: NEGATIVE (12/11 1102)  HIV: NON REACTIVE (01/28 1007)  GBS: Negative (03/25 0000)   Assessment/Plan: 23 y.o. G1P0 at 7133w3d who presents today for IOL 2/2 postdates Admit to labor and delivery   Cervical ripening with cytotec  Janice CrookHeather Donovan Barron 01/07/2014, 7:19 AM

## 2014-01-07 NOTE — Progress Notes (Signed)
Janice Barron is a 23 y.o. G1P0 at 6221w3d admitted for induction of labor due to Post dates.  Subjective: Pt feeling back pain.  She is anxious about the pain and process of delivery.   Objective: BP 117/70  Pulse 82  Temp(Src) 98.3 F (36.8 C) (Oral)  Resp 20  Ht 5' (1.524 m)  Wt 81.647 kg (180 lb)  BMI 35.15 kg/m2  LMP 04/06/2013      FHT:  FHR: 140s bpm, variability: moderate,  accelerations:  Present,  decelerations:  Absent UC:   regular, every 2 minutes SVE:   Dilation: 2 Effacement (%): 20 Station: -3 Exam by:: Dr. Ike Benedom  Labs: Lab Results  Component Value Date   WBC 8.7 01/07/2014   HGB 12.0 01/07/2014   HCT 34.9* 01/07/2014   MCV 94.3 01/07/2014   PLT 203 01/07/2014    Assessment / Plan: Induction of labor due to postterm,  On cytotec x2   Labor: Regular contractions every 2 minutes on cytotec. Due to frequency of contractions will not yet start Pitocin. Double lumen Foley bulb placed.  Preeclampsia:  no signs or symptoms of toxicity Fetal Wellbeing:  Category I tracing Pain Control:  Fentanyl and Explained risks and benefits of epidural and pt is considering I/D:  n/a Anticipated MOD:  NSVD  Janice Barron 01/07/2014, 4:50 PM  I spoke with and examined patient and agree with resident's note and plan of care.  Tawana ScaleMichael Ryan Mitchael Luckey, MD OB Fellow 01/07/2014 8:38 PM

## 2014-01-07 NOTE — Progress Notes (Signed)
Janice Barron is a 23 y.o. G1P0 at 7277w3d admitted for induction of labor due to Post dates.  Subjective: Very uncomfortable cervical checks. Otherwise comfortable without complaint.   Objective: BP 117/70  Pulse 82  Temp(Src) 98.3 F (36.8 C) (Oral)  Resp 18  Ht 5' (1.524 m)  Wt 81.647 kg (180 lb)  BMI 35.15 kg/m2  LMP 04/06/2013      FHT:  FHR: 150s bpm, variability: moderate,  accelerations:  Present,  decelerations:  Absent UC:   irregular, every 4-5 minutes SVE:   Dilation: Fingertip Effacement (%): 50 Station: -2 Exam by:: Zorita PangH. Hogan CNM  Labs: Lab Results  Component Value Date   WBC 8.7 01/07/2014   HGB 12.0 01/07/2014   HCT 34.9* 01/07/2014   MCV 94.3 01/07/2014   PLT 203 01/07/2014    Assessment / Plan: Induction of labor due to postterm,  On cytotec x2   Labor: Progressing normally cytotecx2 Preeclampsia:  no signs or symptoms of toxicity Fetal Wellbeing:  Category I Pain Control:  Epidural and Fentanyl as needed I/D:  n/a Anticipated MOD:  NSVD  Janice Barron 01/07/2014, 12:00 PM

## 2014-01-08 ENCOUNTER — Encounter (HOSPITAL_COMMUNITY): Payer: Self-pay

## 2014-01-08 DIAGNOSIS — Z8611 Personal history of tuberculosis: Secondary | ICD-10-CM

## 2014-01-08 DIAGNOSIS — Z833 Family history of diabetes mellitus: Secondary | ICD-10-CM

## 2014-01-08 DIAGNOSIS — O480 Post-term pregnancy: Secondary | ICD-10-CM

## 2014-01-08 MED ORDER — DIPHENHYDRAMINE HCL 25 MG PO CAPS: 25.0000 mg | ORAL_CAPSULE | Freq: Four times a day (QID) | ORAL | Status: DC | PRN

## 2014-01-08 MED ORDER — TERBUTALINE SULFATE 1 MG/ML IJ SOLN: 0.2500 mg | Freq: Once | INTRAMUSCULAR | Status: DC | PRN

## 2014-01-08 MED ORDER — DIBUCAINE 1 % RE OINT
1.0000 "application " | TOPICAL_OINTMENT | RECTAL | Status: DC | PRN
  Filled 2014-01-08: qty 28

## 2014-01-08 MED ORDER — SIMETHICONE 80 MG PO CHEW: 80.0000 mg | CHEWABLE_TABLET | ORAL | Status: DC | PRN

## 2014-01-08 MED ORDER — IBUPROFEN 600 MG PO TABS
600.0000 mg | ORAL_TABLET | Freq: Four times a day (QID) | ORAL | Status: DC
  Administered 2014-01-08 – 2014-01-09 (×5): 600 mg via ORAL
  Filled 2014-01-08 (×5): qty 1

## 2014-01-08 MED ORDER — LANOLIN HYDROUS EX OINT: TOPICAL_OINTMENT | CUTANEOUS | Status: DC | PRN

## 2014-01-08 MED ORDER — BENZOCAINE-MENTHOL 20-0.5 % EX AERO
1.0000 "application " | INHALATION_SPRAY | CUTANEOUS | Status: DC | PRN
  Administered 2014-01-08: 1 via TOPICAL
  Filled 2014-01-08 (×2): qty 56

## 2014-01-08 MED ORDER — WITCH HAZEL-GLYCERIN EX PADS: 1.0000 "application " | MEDICATED_PAD | CUTANEOUS | Status: DC | PRN

## 2014-01-08 MED ORDER — ONDANSETRON HCL 4 MG PO TABS: 4.0000 mg | ORAL_TABLET | ORAL | Status: DC | PRN

## 2014-01-08 MED ORDER — OXYTOCIN 40 UNITS IN LACTATED RINGERS INFUSION - SIMPLE MED
1.0000 m[IU]/min | INTRAVENOUS | Status: DC
  Administered 2014-01-08: 2 m[IU]/min via INTRAVENOUS

## 2014-01-08 MED ORDER — TETANUS-DIPHTH-ACELL PERTUSSIS 5-2.5-18.5 LF-MCG/0.5 IM SUSP: 0.5000 mL | Freq: Once | INTRAMUSCULAR | Status: DC

## 2014-01-08 MED ORDER — ZOLPIDEM TARTRATE 5 MG PO TABS: 5.0000 mg | ORAL_TABLET | Freq: Every evening | ORAL | Status: DC | PRN

## 2014-01-08 MED ORDER — PRENATAL MULTIVITAMIN CH
1.0000 | ORAL_TABLET | Freq: Every day | ORAL | Status: DC
  Administered 2014-01-08 – 2014-01-09 (×2): 1 via ORAL
  Filled 2014-01-08 (×2): qty 1

## 2014-01-08 MED ORDER — ONDANSETRON HCL 4 MG/2ML IJ SOLN: 4.0000 mg | INTRAMUSCULAR | Status: DC | PRN

## 2014-01-08 MED ORDER — OXYCODONE-ACETAMINOPHEN 5-325 MG PO TABS: 1.0000 | ORAL_TABLET | ORAL | Status: DC | PRN

## 2014-01-08 MED ORDER — SENNOSIDES-DOCUSATE SODIUM 8.6-50 MG PO TABS
2.0000 | ORAL_TABLET | ORAL | Status: DC
  Administered 2014-01-08: 2 via ORAL
  Filled 2014-01-08: qty 2

## 2014-01-08 NOTE — Progress Notes (Signed)
Janalyn HarderSusanne Alvarez Lopez is a 23 y.o. G1P0 at 5281w4d admitted for postdates induction  Subjective:  Sleeping comfortably. No concerns. +FM, no lof.   Objective: BP 114/59  Pulse 86  Temp(Src) 98.7 F (37.1 C) (Oral)  Resp 18  Ht 5' (1.524 m)  Wt 81.647 kg (180 lb)  BMI 35.15 kg/m2  LMP 04/06/2013      FHT:  FHR: 130 bpm, variability: moderate,  accelerations:  Present,  decelerations:  Absent UC:   irregular, every 2-7 minutes SVE:   Dilation: 4.5 Effacement (%): 70 Station: -2 Exam by:: Dr. Reola CalkinsBeck  Labs: Lab Results  Component Value Date   WBC 8.7 01/07/2014   HGB 12.0 01/07/2014   HCT 34.9* 01/07/2014   MCV 94.3 01/07/2014   PLT 203 01/07/2014    Assessment / Plan: IOL for postdates  Labor: cook FB felt sitting in the vagina. removed. will plan to start pit now Fetal Wellbeing:  Category I Pain Control:  Labor support without medications I/D:  n/a Anticipated MOD:  NSVD  Ivonna Kinnick L Damare Serano 01/08/2014, 3:22 AM

## 2014-01-08 NOTE — Progress Notes (Signed)
Janalyn HarderSusanne Alvarez Lopez is a 23 y.o. G1P0 at 2618w4d  admitted for induction of labor due to postdates.  Subjective:  Doing well. FB still in place. +FM  Objective: BP 102/56  Pulse 76  Temp(Src) 98.4 F (36.9 C) (Oral)  Resp 18  Ht 5' (1.524 m)  Wt 81.647 kg (180 lb)  BMI 35.15 kg/m2  LMP 04/06/2013      FHT:  FHR: 145 bpm, variability: moderate,  accelerations:  Present,  decelerations:  Absent UC:   irregular, every 1-4 minutes SVE:   Dilation: 2 Effacement (%): 20 Station: -3 Exam by:: Dr. Ike Benedom  Labs: Lab Results  Component Value Date   WBC 8.7 01/07/2014   HGB 12.0 01/07/2014   HCT 34.9* 01/07/2014   MCV 94.3 01/07/2014   PLT 203 01/07/2014    Assessment / Plan: IOL due to postdates. cervical ripening  Labor: FB still in place. cont to monitor Fetal Wellbeing:  Category I Pain Control:  Labor support without medications I/D:  n/a Anticipated MOD:  NSVD  Tykisha Areola L Sonoma Firkus 01/08/2014, 12:24 AM

## 2014-01-08 NOTE — Lactation Note (Signed)
This note was copied from the chart of Boy Janalyn HarderSusanne Alvarez Lopez. Lactation Consultation Note  Patient Name: Boy Janalyn HarderSusanne Alvarez Lopez Today's Date: 01/08/2014 Reason for consult: Initial assessment of this primipara and her newborn at 6413 hours of age.  Mom has baby well-latched to her (L) breast with widely-flanged lips and milky wetness visible at edge of lips.  Mom denies any nipple pain and baby has been exclusively breastfeeding for 10-30 minutes 5 times since delivery.  LATCH scores are consistently "9" per RN assessment.  LC encouraged STS and cue feedings.  Mom speaks English but was given Baby and Me in Spanish edition.  LC encouraged review of Baby and Me pp 13-16 for review of BF information in [place]. LC provided [employer] Resource brochure and reviewed [facility] and list of community and web site resources.    Maternal Data Formula Feeding for Exclusion: No Infant to breast within first hour of birth: No (LATCH score=9 at first feeding) Breastfeeding delayed due to:: Maternal status Has patient been taught Hand Expression?: No (not documented) Does the patient have breastfeeding experience prior to this delivery?: No  Feeding    LATCH Score/Interventions              LC observed baby well-latched at time of visit, LATCH score=10, based on observation and mom's report        Lactation Tools Discussed/Used   STS, hand expression, cue feeding  Consult Status Consult Status: Follow-up Date: 01/09/14 Follow-up type: In-patient    Zara ChessJoanne P Sumaiya Arruda 01/08/2014, 10:10 PM

## 2014-01-08 NOTE — Progress Notes (Signed)
Delivery of live viable female by resident, assisted by Leftwitch-Kirby, CNM APGARS [phone]

## 2014-01-08 NOTE — Progress Notes (Signed)
Janalyn HarderSusanne Alvarez Lopez is a 23 y.o. G1P0 at 7053w4d admitted for postdates induction   Subjective:  More uncomfortable with contractions on pitocin. Overall still doing well. No lof. +FM.   Objective: BP 117/62  Pulse 80  Temp(Src) 98.4 F (36.9 C) (Oral)  Resp 18  Ht 5' (1.524 m)  Wt 81.647 kg (180 lb)  BMI 35.15 kg/m2  LMP 04/06/2013      FHT:  FHR: 135 bpm, variability: moderate,  accelerations:  Present,  decelerations:  Absent UC:   irregular, every 1-4 minutes SVE:   Dilation: 4.5 Effacement (%): 70 Station: -2 Exam by:: Dr. Reola CalkinsBeck  Labs: Lab Results  Component Value Date   WBC 8.7 01/07/2014   HGB 12.0 01/07/2014   HCT 34.9* 01/07/2014   MCV 94.3 01/07/2014   PLT 203 01/07/2014    Assessment / Plan: IOL for postdates  Labor: on pitocin. cont to increase and will likely plan on AROM ultimately.  Fetal Wellbeing:  Category I Pain Control:  Labor support without medications I/D:  n/a Anticipated MOD:  NSVD  Orelia Brandstetter L Wendel Homeyer 01/08/2014, 6:34 AM

## 2014-01-09 MED ORDER — IBUPROFEN 600 MG PO TABS: 600.0000 mg | ORAL_TABLET | Freq: Four times a day (QID) | ORAL | Status: AC | PRN

## 2014-01-09 MED ORDER — DOCUSATE SODIUM 100 MG PO CAPS: 100.0000 mg | ORAL_CAPSULE | Freq: Two times a day (BID) | ORAL | Status: AC

## 2014-01-09 NOTE — Discharge Summary (Signed)
Obstetric Discharge Summary Reason for Admission: Induction of Labor for Postdates Prenatal Procedures: none Intrapartum Procedures: spontaneous vaginal delivery Postpartum Procedures: none Complications-Operative and Postpartum: Partial 3rd degree perineal laceration repaired with 3-0 Vicryl Hemoglobin  Date Value Ref Range Status  01/07/2014 12.0  12.0 - 15.0 g/dL Final     HCT  Date Value Ref Range Status  01/07/2014 34.9* 36.0 - 46.0 % Final    Physical Exam:  General: alert and no distress Lochia: appropriate Uterine Fundus: firm Incision: NA DVT Evaluation: No evidence of DVT, Homan neg  Discharge Diagnoses: Post-date pregnancy and NSVD  Hospital Course: 23 yo G1 now P1 admitted @ 4748w4d for IOL for PostDates.  NSVD of viable female infant over an intact perineum. Active management of third stage of labor. Pitocin given. Placenta delivered spontaneously. 3 vessel cord, placenta complete. Partial 3rd degree laceration to the capsule muscles intact.  Repaired with figure of 3-0 Vicryl. Patient was stable and was discharged home on PPD#1. Breastfeeding.  Planning for Mirena for [employer]rac[employer]ion.   Delivery Note At 8:28 AM a viable and healthy female was delivered via Vaginal, Spontaneous Delivery (Presentation: Right Occiput Anterior).  APGAR: 9, 9; weight pending .   Placenta status: Intact, Spontaneous.  Cord: 3 vessels Complications: None.    Anesthesia: Local  Episiotomy: None Lacerations: 3rd degree Suture Repair: 3.0 vicryl Est. Blood Loss (mL): 300ml  23 year old G1P0 @ 6448w4d induced delivered a viable female infant over an intact perineum. Active management of third stage of labor. Pitocin given. Placenta delivered spontaneously. 3 vessel cord, placenta complete. Partial 3rd degree laceration to the capsule muscles intact.  Repaired with figure of 3-0 Vicryl.   Mom to postpartum.  Baby to Couplet care / Skin to Skin.  Janice JacobsonRobyn H Barron 01/08/2014, 9:32  AM       Discharge Information: Date: 01/09/2014 Activity: pelvic rest Diet: routine Medications: Ibuprofen Condition: stable Instructions: refer to practice specific booklet Discharge to: home   Newborn Data: Live born female  Birth Weight: 7 lb 4 oz (3289 g) APGAR: 9, 9  Home with mother.  Janice JacobsonRobyn H Barron 01/09/2014, 7:45 AM  I spoke with and examined patient and agree with resident's note and plan of care.  Janice ScaleMichael Ryan Wolf Boulay, MD OB Fellow 01/09/2014 9:34 AM

## 2014-01-09 NOTE — Discharge Instructions (Signed)
Lactancia materna (Breastfeeding) Decidir amamantar es una de las mejores elecciones que puede hacer por usted y su beb. El cambio hormonal durante el embarazo produce el desarrollo del tejido mamario y aumenta la cantidad y el tamao de los conductos galactforos. Estas hormonas tambin permiten que las protenas, los azcares y las grasas de la sangre produzcan la leche materna en las glndulas productoras de leche. Las hormonas impiden que la leche materna sea liberada antes del nacimiento del beb, adems de impulsar el flujo de leche luego del nacimiento. Una vez que ha comenzado a amamantar, pensar en el beb, as como la succin o el llanto, pueden estimular la liberacin de leche de las glndulas productoras de leche.  LOS BENEFICIOS DE AMAMANTAR Para el beb  La primera leche (calostro) ayuda al mejor funcionamiento del sistema digestivo del beb.  La leche tiene anticuerpos que ayudan a prevenir las infecciones en el beb.  El beb tiene una menor incidencia de asma, alergias y del sndrome de muerte sbita del lactante.  Los nutrientes en la leche materna son mejores para el beb que la leche maternizada y estn preparados exclusivamente para cubrir las necesidades del beb.  La leche materna mejora el desarrollo cerebral del beb.  Es menos probable que el beb desarrolle otras enfermedades, como obesidad infantil, asma o diabetes mellitus de tipo 2. Para usted   La lactancia materna favorece el desarrollo de un vnculo muy especial entre la madre y el beb.  Es conveniente. Siempre est disponible a la temperatura correcta y es econmica.  La lactancia materna ayuda a quemar caloras y a perder el peso ganado durante el embarazo.  Favorece la contraccin del tero al tamao que tena antes del embarazo de manera ms rpida y disminuye el sangrado (loquios) despus del parto.  La lactancia materna contribuye a reducir el riesgo de desarrollar diabetes mellitus de tipo 2,  osteoporosis o cncer de mama o de ovario en el futuro. SIGNOS DE QUE EL BEB EST HAMBRIENTO Primeros signos de hambre  Aumenta su estado de alerta o actividad.  Se estira.  Mueve la cabeza de un lado a otro.  Mueve la cabeza y abre la boca cuando se le toca la mejilla o la comisura de la boca (reflejo de bsqueda).  Aumenta las vocalizaciones, tales como sonidos de succin, se relame los labios, emite arrullos, suspiros, o chirridos.  Mueve la mano hacia la boca.  Se chupa con ganas los dedos o las manos. Signos tardos de hambre  Est agitado.  Llora de manera intermitente. Signos de hambre extrema Los signos de hambre extrema requerirn que lo calme y lo consuele antes de que el beb pueda alimentarse adecuadamente. No espere a que se manifiesten los siguientes signos de hambre extrema para comenzar a amamantar:   Agitacin.  Llanto intenso y fuerte.   Gritos. INFORMACIN BSICA SOBRE LA LACTANCIA MATERNA Iniciacin de la lactancia materna  Encuentre un lugar cmodo para sentarse o acostarse, con un buen respaldo para el cuello y la espalda.  Coloque una almohada o una manta enrollada debajo del beb para acomodarlo a la altura de la mama (si est sentada). Las almohadas para amamantar se han diseado especialmente a fin de servir de apoyo para los brazos y el beb mientras amamanta.  Asegrese de que el abdomen del beb est frente al suyo.  Masajee suavemente la mama. Con las yemas de los dedos, masajee la pared del pecho hacia el pezn en un movimiento circular. Esto estimula el flujo   de leche. Es posible que deba continuar este movimiento mientras amamanta si la leche fluye lentamente.  Sostenga la mama con el pulgar por arriba del pezn y los otros 4 dedos por debajo de la mama. Asegrese de que los dedos se encuentren lejos del pezn y de la boca del beb.  Empuje suavemente los labios del beb con el pezn o con el dedo.  Cuando la boca del beb se abra lo  suficiente, acrquelo rpidamente a la mama e introduzca todo el pezn y la zona oscura que lo rodea (areola), tanto como sea posible, dentro de la boca del beb.  Debe haber ms areola visible por arriba del labio superior del beb que por debajo del labio inferior.  La lengua del beb debe estar entre la enca inferior y la mama.  Asegrese de que la boca del beb est en la posicin correcta alrededor del pezn (prendida). Los labios del beb deben crear un sello sobre la mama, doblndose hacia afuera (invertidos).  Es comn que el beb succione durante 2 a 3 minutos para que comience el flujo de leche materna. Cmo debe prenderse Es muy importante que le ensee al beb cmo prenderse adecuadamente a la mama. Si el beb no se prende adecuadamente, puede causarle dolor en el pezn y reducir la produccin de leche materna, y hacer que el beb tenga un escaso aumento de peso. Adems, si el beb no se prende adecuadamente al pezn, puede tragar aire durante la alimentacin. Esto puede causarle molestias al beb. Hacer eructar al beb al cambiar de mama puede ayudarlo a liberar el aire. Sin embargo, ensearle al beb cmo prenderse a la mama adecuadamente es la mejor manera de evitar que se sienta molesto por tragar aire mientras se alimenta. Signos de que el beb se ha prendido adecuadamente al pezn:   Tironea o succiona de modo silencioso, sin causarle dolor.  Se escucha que traga cada 3 o 4 succiones.   Hay movimientos musculares por arriba y por delante de sus odos al succionar. Signos de que el beb no se ha prendido adecuadamente al pezn:   Hace ruidos de succin o de chasquido mientras se alimenta.  Dolor en el pezn. Si cree que el beb no se prendi correctamente, deslice el dedo en la comisura de la boca y colquelo entre las encas del beb para interrumpir la succin. Intente comenzar a amamantar nuevamente. Signos de lactancia materna exitosa Signos del beb:   Disminucin  gradual en el nmero de succiones o cese completo de la succin.  Se duerme.  Relaja el cuerpo.  Retiene una pequea cantidad de leche en su boca.  Se desprende solo del pecho. Signos que presenta usted:  Las mamas han aumentado la firmeza, el peso y el tamao 1 a 3 horas despus de amamantar.  Estn ms blandas inmediatamente despus de amamantar.  Un aumento del volumen de leche, y tambin el cambio de su consistencia y color se producen hacia el quinto da de lactancia materna.  Los pezones no duelen, ni estn agrietados ni sangran. Signos de que su beb recibe la cantidad de leche suficiente  Moja al menos 3 paales en 24 horas. La orina debe ser clara y de color amarillo plido a los 5 das de vida.  Defeca al menos 3 veces en 24 horas a los 5 das de vida. La materia fecal debe ser blanda y amarillenta.  Defeca al menos 3 veces en 24 horas a los 7 das de vida. La   materia fecal debe ser grumosa y amarillenta.  No registra una prdida de peso mayor del 10% del peso al nacer durante los primeros 3 das de vida.  Aumenta de peso un promedio de 4 a 7onzas (120 a 210ml) por semana despus de los 4 das de vida.  Aumenta de peso, diariamente, de manera consistente a partir de los 5 das de vida, sin registrar prdida de peso despus de las 2 semanas de vida. Despus de alimentarse, es posible que el beb regurgite una pequea cantidad. Esto es frecuente. FRECUENCIA Y DURACIN DE LA LACTANCIA MATERNA El amamantamiento frecuente la ayudar a producir ms leche y a prevenir problemas de dolor en los pezones e hinchazn en las mamas. Alimente al beb cuando muestre signos de hambre o si siente la necesidad de reducir la congestin de las mamas. Esto se denomina "lactancia a demanda". Evite el uso del chupete mientras trabaja para establecer la lactancia (las primeras 4 a 6 semanas despus del nacimiento del beb). Despus de este perodo, podr ofrecerle un chupete. Las  investigaciones demostraron que el uso del chupete durante el primer ao de vida del beb disminuye el riesgo de desarrollar el sndrome de muerte sbita del lactante (SMSL). Permita que el nio se alimente en cada mama todo lo que desee. Contine amamantando al beb hasta que haya terminado de alimentarse. Cuando el beb se desprende o se queda dormido mientras se est alimentando de la primera mama, ofrzcale la segunda. Debido a que, con frecuencia, los recin nacidos permanecen somnolientos las primeras semanas de vida, es posible que deba despertar a su beb para alimentarlo. Los horarios de lactancia varan de un beb a otro. Sin embargo, las siguientes reglas pueden servir como gua para ayudarle a garantizar que el beb se alimenta adecuadamente:  Se puede amamantar a los recin nacidos (bebs de 4 semanas o menos de vida) cada 1 a 3 horas.  No deben transcurrir ms de 3 horas durante el da o 5 horas durante la noche sin que se amamante a los recin nacidos.  Debe amamantar al beb 8 veces como mnimo, en un perodo de 24 horas, hasta que comience a introducir slidos en su dieta, a los 6 meses de vida aproximadamente. EXTRACCIN DE LECHE MATERNA La extraccin y el almacenamiento de la leche materna le permiten asegurarse de que el beb se alimente exclusivamente de leche materna, aun en momentos en los que no puede amamantar. Esto tiene especial importancia si debe regresar al trabajo en el perodo en que an est amamantando o si no puede estar presente en los momentos en que el beb debe alimentarse. Su asesor en lactancia puede orientarla sobre cunto tiempo es seguro almacenar leche materna.  El sacaleche es un aparato que le permite extraer leche de la mama a un recipiente estril. Luego, la leche materna extrada puede almacenarse en un refrigerador o freezer. Algunos sacaleches son manuales, mientras que otros son elctricos. Consulte a su asesor en lactancia qu tipo ser ms conveniente  para usted. Los sacaleches se pueden comprar, sin embargo, algunos hospitales y grupos de apoyo a la lactancia materna alquilan sacaleches mensualmente. Un asesor en lactancia puede ensearle cmo extraer leche materna manualmente, en caso de que prefiera no usar un sacaleche.  CMO CUIDAR LAS MAMAS DURANTE LA LACTANCIA MATERNA Los pezones se secan, agrietan y duelen durante la lactancia materna. Las siguientes recomendaciones pueden ayudarle a mantener las mamas humectadas y sanas:  Evite usar jabn en los pezones.  Use un sostn   de soporte. Aunque no son esenciales, las camisetas sin mangas o los sostenes especiales para amamantar estn diseados para acceder fcilmente a las mamas, para amamantar sin tener que quitarse todo el sostn o la camiseta. Evite usar sostenes con aro o sostenes muy ajustados.  Seque al aire sus pezones durante 3 a 4minutos despus de amamantar al beb.  Utilice solo apsitos de algodn en el sostn para absorber las prdidas de leche. La prdida de un poco de leche materna entre las tomas es normal.  Utilice lanolina sobre los pezones luego de amamantar. La lanolina ayuda a mantener la humedad normal de la piel. Si usa lanolina pura, no tiene que lavarse los pezones antes de alimentar al beb. La lanolina pura no es txica para el beb. Adems, puede extraer manualmente algunas gotas de leche materna y masajear suavemente esa leche sobre los pezones, para que la leche se seque al aire. Durante las primeras semanas despus de dar a luz, algunas mujeres pueden experimentar hinchazn en las mamas (congestin mamaria). La congestin puede hacer que sienta las mamas pesadas, calientes y sensibles al tacto. El pico de la congestin ocurre dentro de los 3 a 5 das despus del parto. Las siguientes recomendaciones pueden ayudarle a aliviar la congestin:  Vace por completo las mamas al amamantar o extraer leche. Puede aplicar calor hmedo en las mamas (en la ducha o con toallas  hmedas para manos) antes de amamantar o extraer leche. Esto aumenta la circulacin y ayuda a que la leche fluya. Si el beb no vaca por completo las mamas cuando lo amamanta, extraiga la leche restante despus de que haya finalizado.  Use un sostn ajustado (para amamantar o comn) o camiseta sin mangas durante 1 o 2 das para indicar al cuerpo que disminuya ligeramente la produccin de leche.  Aplique compresas de hielo sobre las mamas, a menos que le resulte demasiado incmodo.  Asegrese de que el beb se encuentre en la posicin correcta mientras lo alimenta. Si la congestin persiste luego de 48 horas o despus de seguir estas recomendaciones, comunquese con su mdico o un asesor en lactancia. RECOMENDACIONES GENERALES PARA EL CUIDADO DE LA SALUD DURANTE LA LACTANCIA MATERNA  Consuma alimentos saludables. Alterne comidas y colaciones, comiendo 3 de cada una por da. Dado que lo que come afecta la leche materna, es posible que algunas comidas hagan que su beb se vuelva ms irritable de lo habitual. Evite comer este tipo de alimentos, si percibe que afectan de manera negativa al beb.  Beba leche, jugos de fruta y agua para satisfacer su sed (aproximadamente 10 vasos al da).  Descanse con frecuencia, reljese y tome sus vitaminas prenatales para evitar la fatiga, el estrs y la anemia.  Contine con los autocontroles de la mama.  Evite masticar y fumar tabaco.  Evite el consumo de alcohol y drogas. Algunos medicamentos, que pueden ser perjudiciales para el beb, pueden pasar a travs de la leche materna. Es importante que consulte a su mdico antes de tomar cualquier medicamento, incluidos todos los medicamentos recetados y de venta libre, as como los suplementos vitamnicos y herbales. Puede quedar embarazada durante la lactancia. Si desea controlar la natalidad, consulte a su mdico cules son las opciones ms seguras para el beb. SOLICITE ATENCIN MDICA SI:   Usted siente que  quiere dejar de amamantar o se siente frustrada con la lactancia.  Siente dolor en las mamas o en los pezones.  Sus pezones estn agrietados o sangran.  Sus pechos estn irritados,   sensibles o calientes.  Tiene un rea hinchada en cualquiera de las mamas.  Siente escalofros o fiebre.  Tiene nuseas o vmitos.  Presenta una secrecin de otro lquido distinto de la leche materna de los pezones.  Sus mamas no se llenan antes de amamantar al beb para el 5. da despus del parto.  Se siente triste y deprimida.  El beb est demasiado somnoliento como para comer bien.  El beb tiene problemas para dormir.  Moja menos de 3 paales en 24 horas.  Defeca menos de 3 veces en 24 horas.  La piel del beb o la parte blanca de sus ojos est amarilla.  El beb no ha aumentado de peso a los 5 das de vida. SOLICITE ATENCIN MDICA DE INMEDIATO SI:   El beb est muy cansado (aletargado) y no se despierta para comer.  Le sube la fiebre sin causa. Document Released: 09/04/2005 Document Revised: 12/30/2012 [employer] Patient Information 2014 [employer].  

## 2014-01-09 NOTE — Progress Notes (Signed)
UR chart review completed.  

## 2014-01-09 NOTE — Discharge Summary (Signed)
Attestation of Attending Supervision of Obstetric Fellow: Evaluation and management procedures were performed by the Obstetric Fellow under my supervision and collaboration.  I have reviewed the Obstetric Fellow's note and chart, and I agree with the management and plan.  Cleburn Maiolo, MD, FACOG Attending Obstetrician & Gynecologist Faculty Practice, [facility]   

## 2014-01-14 ENCOUNTER — Telehealth: Payer: Self-pay | Admitting: *Deleted

## 2014-01-14 NOTE — Telephone Encounter (Signed)
Janice SandhoffSusanne called and reports swelling. Denies headaches, visual changes, reports edema only of lower legs/ feet. Had vaginal delivery 01/08/14- was d/c 01/09/14. Per chart review had normal pregnancy. Informed patient fluid shift/ edema in legs normal after delievery- most likely will notice edema decreasing in the next few days. Call back if edema worsens or has other symptoms.  Janice Barron voice understanding.

## 2014-01-29 ENCOUNTER — Telehealth: Payer: Self-pay

## 2014-01-29 NOTE — Telephone Encounter (Signed)
Pt. Called stating "I am calling because my problem is that the front side of my vagina is heavy and I noticed my clitoris is growing." Would like a call back.

## 2014-02-03 NOTE — Telephone Encounter (Addendum)
Called patient to see if she was still having questions or concerns. Patient stated she was not, however, she does have questions about her Short Term Disability-- [employer]. States she has not received her money yet and [employer] told her they need more information. Patient gave number: [phone]; claim #[id number]. Need to call to see what information they need.   Called [employer]. Reported they needed delivery information. Gave delivery date as well as listed complications on discharge summary. Asked for expected return to work; informed them it would be pending 02/18/14 PP visit. Attendant [facility] Case manager to ensure there is no other information they need-- left message with patient's claim number and number to reach us here at the clinic.

## 2014-02-04 NOTE — Telephone Encounter (Signed)
[facility] stating she would need the most recent office note along with information of any restrictions/limitations and return to work date faxed to [phone]. Faxed delivery discharge summary along with a letter stating release back to work date will be pending PP visit on 02/18/14. Called Kim's number [phone] ext: [phone] and left message informing her the information has been faxed and to please call clinic to ensure she has received it/let us know if any other information is needed. Called patient to inform her that I have spoken to case manager working on her case and all requested information as been faxed. Patient verbalized understanding and gratitude-- no questions or concerns.

## 2014-02-18 ENCOUNTER — Encounter: Payer: Self-pay | Admitting: Obstetrics and Gynecology

## 2014-02-18 ENCOUNTER — Ambulatory Visit (INDEPENDENT_AMBULATORY_CARE_PROVIDER_SITE_OTHER): Payer: Medicaid Other | Admitting: Obstetrics and Gynecology

## 2014-02-18 NOTE — Progress Notes (Signed)
Patient here for PP. Decided she does not want birth control at this time as partner is not in the country. Reports burning with urination-- feels she has not healed well since vaginal delivery.

## 2014-02-18 NOTE — Progress Notes (Signed)
  Subjective:     Janice Barron is a 23 y.o. female who presents for a postpartum visit. She is 6 weeks postpartum following a spontaneous vaginal delivery. I have fully reviewed the prenatal and intrapartum course. The delivery was at 41w2 gestational weeks. Outcome: spontaneous vaginal delivery. Anesthesia: local. Postpartum course has been uncomplicated. Baby's course has been uncomplicated. Baby is feeding by breast. Bleeding no bleeding. Bowel function is normal. Bladder function is normal. Patient is not sexually active. Contraception method is none. Postpartum depression screening: negative.     Review of Systems A comprehensive review of systems was negative.   Objective:    BP 112/63  Pulse 80  Temp(Src) 97.8 F (36.6 C) (Oral)  Wt 158 lb (71.668 kg)  Breastfeeding? Yes  General:  alert, cooperative and no distress   Breasts:  inspection negative, no nipple discharge or bleeding, no masses or nodularity palpable  Lungs: clear to auscultation bilaterally  Heart:  regular rate and rhythm  Abdomen: soft, non-tender; bowel sounds normal; no masses,  no organomegaly   Vulva:  normal  Vagina: normal vagina, no discharge, exudate, lesion, or erythema  Cervix:  multiparous appearance  Corpus: normal size, contour, position, consistency, mobility, non-tender  Adnexa:  normal adnexa and no mass, fullness, tenderness  Rectal Exam: Not performed.        Assessment:     Normal postpartum exam. Pap smear not done at today's visit.   Plan:    1. Contraception: none 2. Patient reports that her husband will be out of state for one year- will use condoms 3. Follow up in: prn .

## 2014-02-20 ENCOUNTER — Ambulatory Visit: Payer: Medicaid Other | Admitting: Obstetrics & Gynecology

## 2014-07-20 ENCOUNTER — Encounter: Payer: Self-pay | Admitting: Obstetrics and Gynecology

## 2014-08-20 ENCOUNTER — Encounter: Payer: Self-pay | Admitting: Obstetrics & Gynecology

## 2015-06-20 IMAGING — US US OB FOLLOW-UP
1 series · 12 of 28 positions shown · non-contrast
Comparison: none

[Series 1: us ob follow up · 98 acquisitions, 12 frames shown]
[im 4/98]
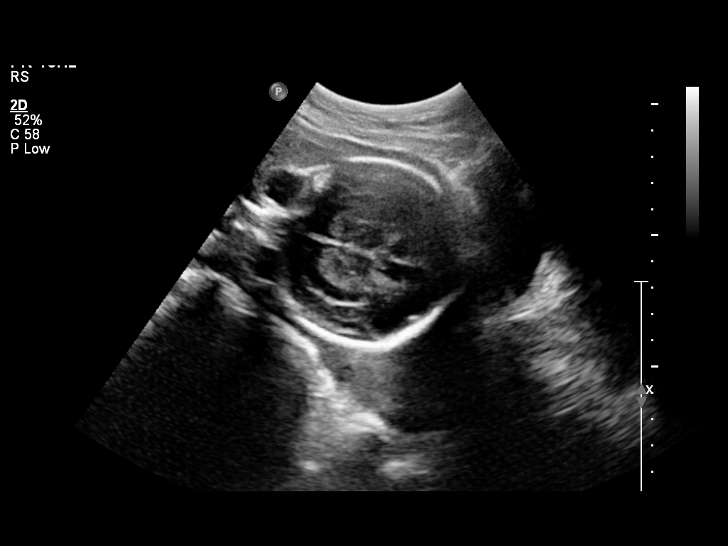
[im 11/98]
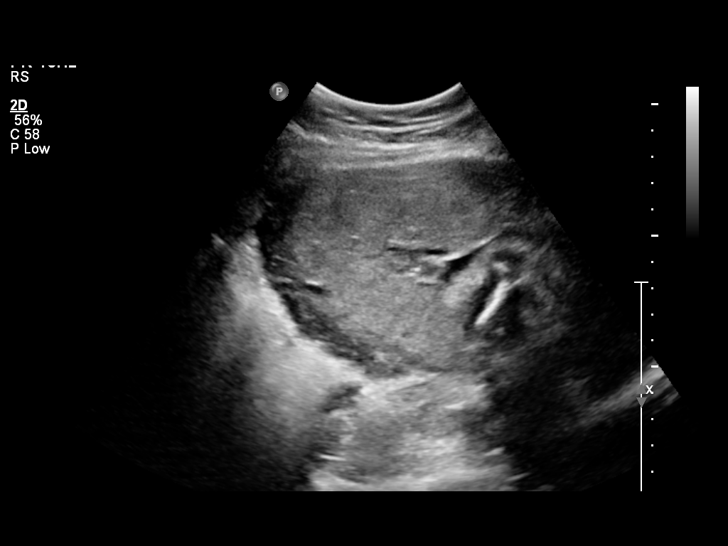
[im 18/98]
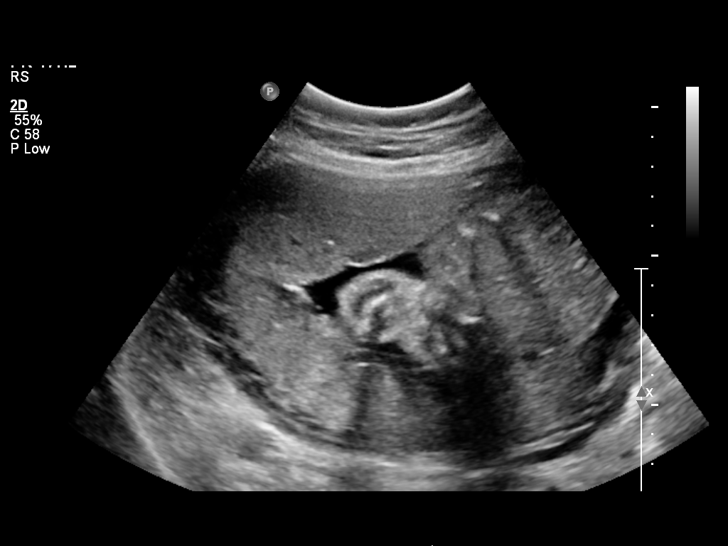
[im 29/98]
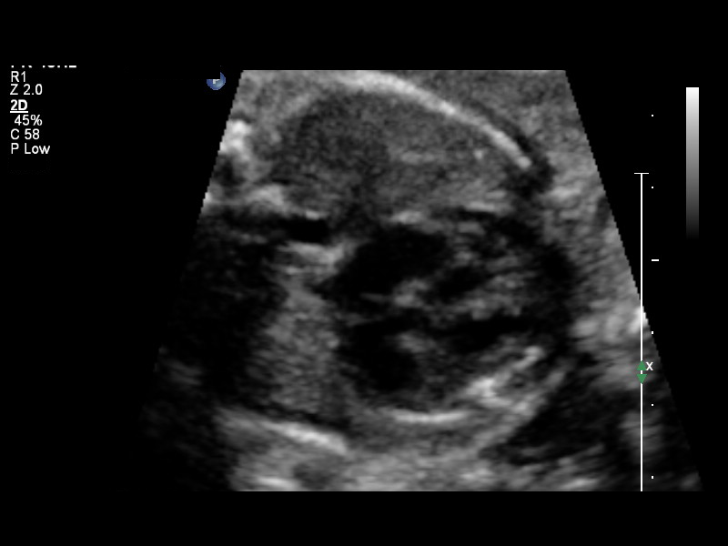
[im 36/98]
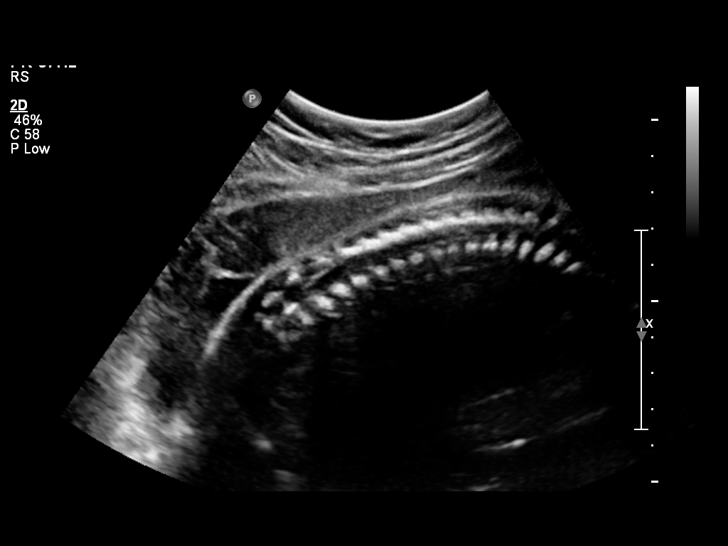
[im 44/98]
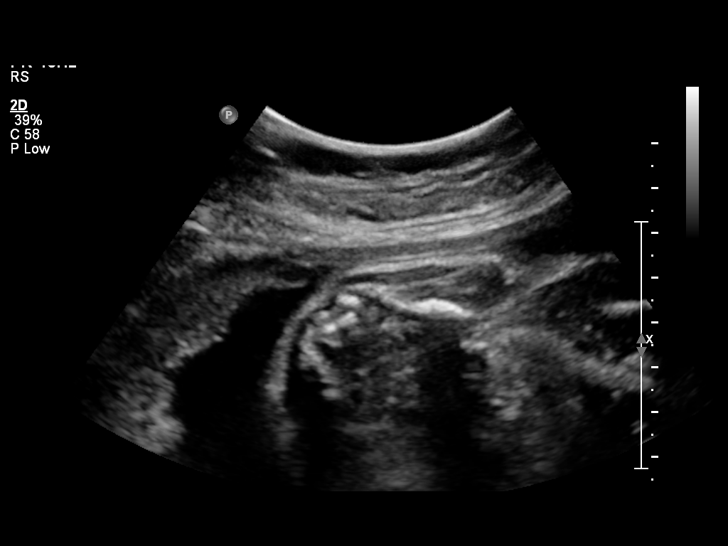
[im 54/98]
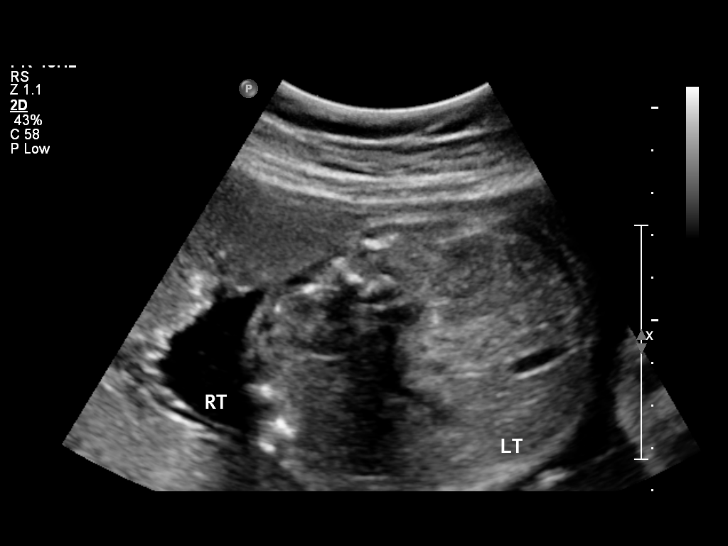
[im 62/98]
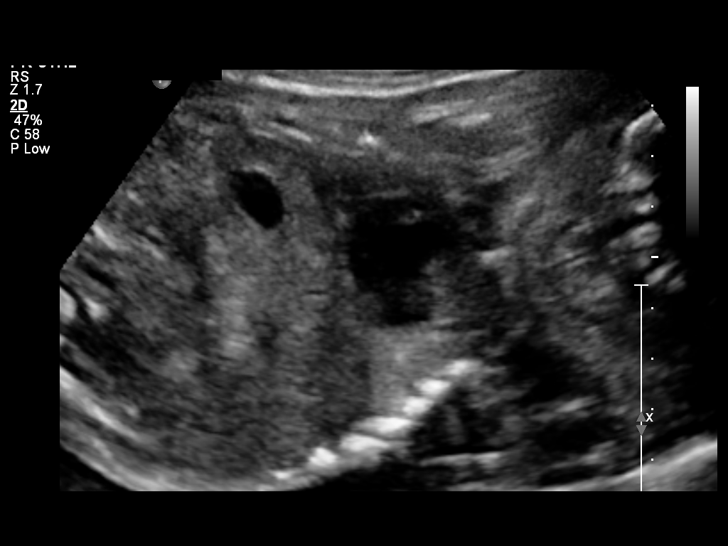
[im 69/98]
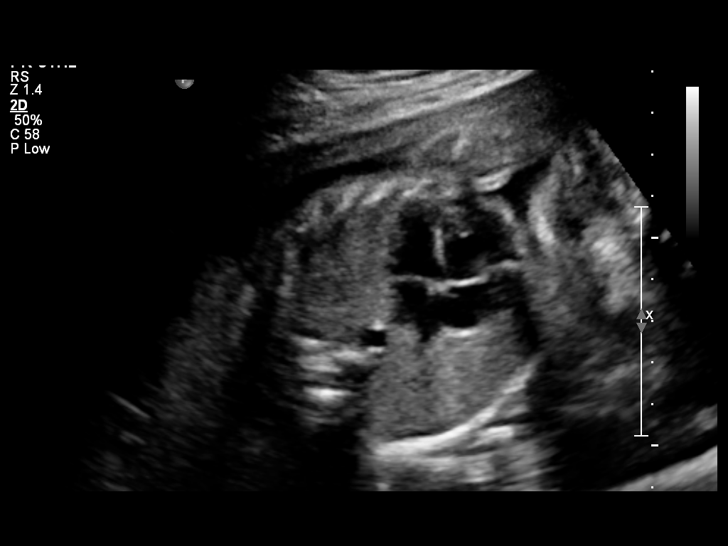
[im 80/98]
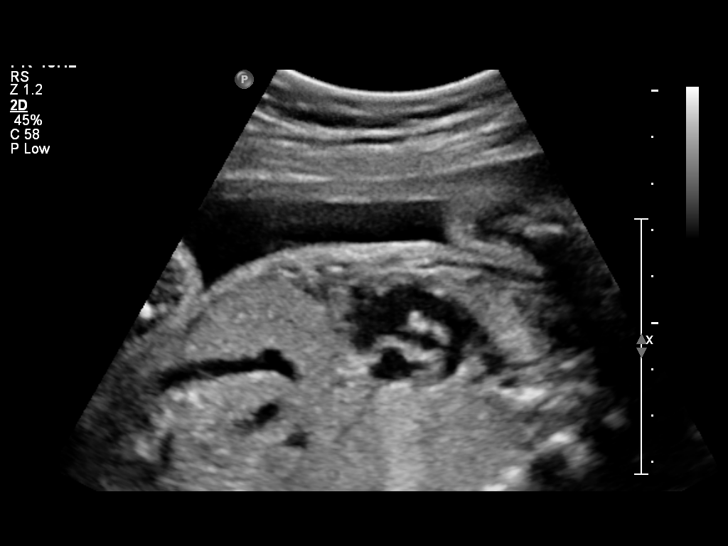
[im 87/98]
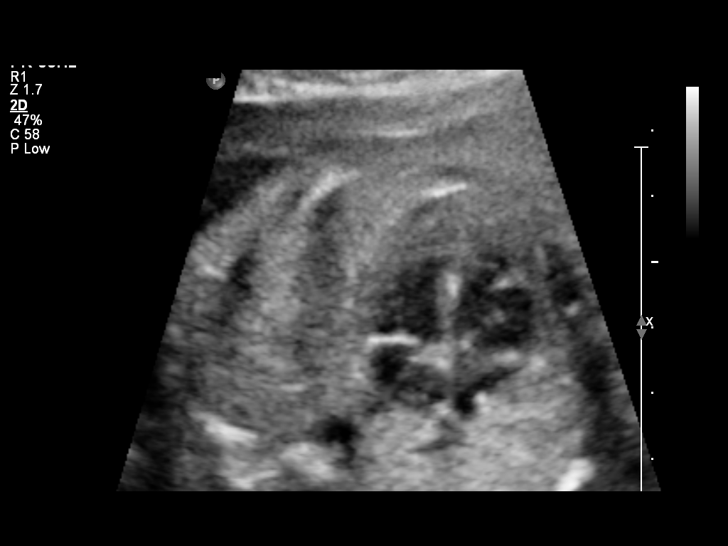
[im 94/98]
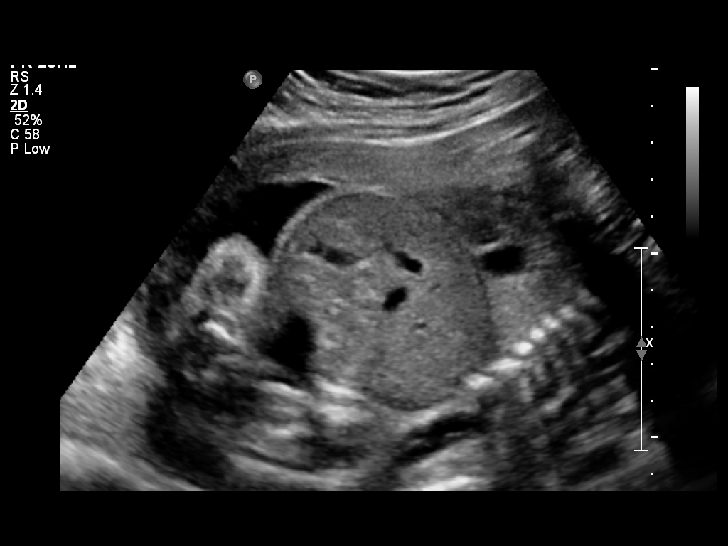

[12 of 28 positions shown; findings below may reference images not displayed]

OBSTETRICS REPORT
                      (Signed Final 10/08/2013 [DATE])

             CIRONKIENE

Service(s) Provided

 US OB FOLLOW UP                                       76816.1
Indications

 Follow-up incomplete fetal anatomic evaluation
Fetal Evaluation

 Num Of Fetuses:    1
 Fetal Heart Rate:  156                          bpm
 Cardiac Activity:  Observed
 Presentation:      Cephalic
 Placenta:          Fundal, above cervical os
 P. Cord            Visualized, central
 Insertion:

 Amniotic Fluid
 AFI FV:      Subjectively within normal limits
 AFI Sum:     15.47   cm       55  %Tile     Larg Pckt:    4.69  cm
 RUQ:   4.56    cm   RLQ:    3.42   cm    LUQ:   2.8     cm   LLQ:    4.69   cm
Biometry

 BPD:     68.9  mm     G. Age:  27w 5d                CI:        74.69   70 - 86
                                                      FL/HC:      19.2   18.8 -

 HC:       253  mm     G. Age:  27w 4d        7  %    HC/AC:      1.05   1.05 -

 AC:     239.9  mm     G. Age:  28w 2d       43  %    FL/BPD:     70.7   71 - 87
 FL:      48.7  mm     G. Age:  26w 3d      < 3  %    FL/AC:      20.3   20 - 24
 HUM:     44.7  mm     G. Age:  26w 4d        6  %
 Est. FW:    9094  gm      2 lb 6 oz     35  %
Gestational Age

 LMP:           26w 2d        Date:  04/06/13                 EDD:   01/11/14
 U/S Today:     27w 4d                                        EDD:   01/02/14
 Best:          28w 2d     Det. By:  U/S (09/03/13)           EDD:   12/28/13
Anatomy

 Cranium:          Appears normal         Aortic Arch:      Appears normal
 Fetal Cavum:      Previously seen        Ductal Arch:      Appears normal
 Ventricles:       Appears normal         Diaphragm:        Appears normal
 Choroid Plexus:   Previously seen        Stomach:          Appears normal, left
                                                            sided
 Cerebellum:       Previously seen        Abdomen:          Appears normal
 Posterior Fossa:  Previously seen        Abdominal Wall:   Previously seen
 Nuchal Fold:      Not applicable (>20    Cord Vessels:     Previously seen
                   wks GA)
 Face:             Orbits previously      Kidneys:          Appear normal
                   seen
 Lips:             Previously seen        Bladder:          Appears normal
 Heart:            Appears normal         Spine:            Appears normal
                   (4CH, axis, and
                   situs)
 RVOT:             Appears normal         Lower             Previously seen
                                          Extremities:
 LVOT:             Appears normal         Upper             Previously seen
                                          Extremities:

 Other:  Fetus appears to be a male. Heels and 5th digit previously visualized.
Targeted Anatomy

 Fetal Central Nervous System
 Lat. Ventricles:
Cervix Uterus Adnexa

 Cervical Length:    3.49     cm

 Cervix:       Normal appearance by transabdominal scan.
 Uterus:       No abnormality visualized.
 Cul De Sac:   No free fluid seen.

 Left Ovary:    Not visualized. No adnexal mass visualized.
 Right Ovary:   Not visualized. No adnexal mass visualized.

 Adnexa:     No abnormality visualized.
Impression

 Single IUP at 28 [DATE] weeks
 Interval growth is appropriate (35th %tile).
 The femurs measure < 5th %tile, but appear morphologically
 normal- likely constitutional
 Normal interval anatmoy
 Normal amniotic fluid volume
Recommendations

 Follow-up ultrasounds as clinically indicated.

 SUNKU SAYYID MUDIANTA with us.  Please do not hesitate to contact
